# Patient Record
Sex: Female | Born: 1990
Health system: Southern US, Community
[De-identification: ages and names within clinical notes are randomized; demographics above are authoritative.]

## PROBLEM LIST (undated history)

## (undated) DIAGNOSIS — D649 Anemia, unspecified: Secondary | ICD-10-CM

## (undated) DIAGNOSIS — R519 Headache, unspecified: Secondary | ICD-10-CM

## (undated) HISTORY — DX: Headache, unspecified: R51.9

## (undated) HISTORY — DX: Anemia, unspecified: D64.9

---

## 2012-12-23 ENCOUNTER — Other Ambulatory Visit: Payer: Self-pay

## 2012-12-23 MED ORDER — LEVONORGESTREL-ETHINYL ESTRAD 0.1-20 MG-MCG PO TABS: 1.0000 | ORAL_TABLET | Freq: Every day | ORAL | Status: DC

## 2012-12-23 NOTE — Telephone Encounter (Signed)
Last PAP 09/28/11  Last seen 07/05/12   MMM

## 2013-01-20 ENCOUNTER — Other Ambulatory Visit: Payer: Self-pay | Admitting: Nurse Practitioner

## 2013-01-21 ENCOUNTER — Telehealth: Payer: Self-pay | Admitting: Nurse Practitioner

## 2013-01-21 MED ORDER — LEVONORGESTREL-ETHINYL ESTRAD 0.1-20 MG-MCG PO TABS: 1.0000 | ORAL_TABLET | Freq: Every day | ORAL | Status: DC

## 2013-01-21 NOTE — Telephone Encounter (Signed)
Just refilled from rx refills- will redo with refills but needs pap if hasn't had one this year

## 2013-01-21 NOTE — Telephone Encounter (Signed)
Have already been refilled

## 2013-01-21 NOTE — Telephone Encounter (Signed)
MMM TO ADDRESS

## 2013-01-21 NOTE — Telephone Encounter (Signed)
Last seen 07/05/12  MMM Last PAP 5/13

## 2013-01-21 NOTE — Telephone Encounter (Signed)
Patient wants to know why she does not have refills. Her bottle said good thru 2015 but she doesn't have any rfs?

## 2013-01-22 NOTE — Telephone Encounter (Signed)
Pt aware that done with rf's

## 2013-02-21 ENCOUNTER — Other Ambulatory Visit: Payer: Self-pay | Admitting: Nurse Practitioner

## 2013-02-24 NOTE — Telephone Encounter (Signed)
Last seen 07/05/12  MMM  Last PAP 09/27/11

## 2013-03-12 ENCOUNTER — Other Ambulatory Visit: Payer: Self-pay | Admitting: Nurse Practitioner

## 2013-04-16 ENCOUNTER — Other Ambulatory Visit: Payer: Self-pay | Admitting: Nurse Practitioner

## 2013-05-05 ENCOUNTER — Encounter: Payer: Self-pay | Admitting: Nurse Practitioner

## 2013-05-05 ENCOUNTER — Encounter (INDEPENDENT_AMBULATORY_CARE_PROVIDER_SITE_OTHER): Payer: Self-pay

## 2013-05-05 ENCOUNTER — Ambulatory Visit (INDEPENDENT_AMBULATORY_CARE_PROVIDER_SITE_OTHER): Payer: 59 | Admitting: Nurse Practitioner

## 2013-05-05 VITALS — BP 129/75 | HR 81 | Temp 97.9°F | Ht 66.0 in | Wt 165.0 lb

## 2013-05-05 DIAGNOSIS — Z0000 Encounter for general adult medical examination without abnormal findings: Secondary | ICD-10-CM

## 2013-05-05 DIAGNOSIS — Z124 Encounter for screening for malignant neoplasm of cervix: Secondary | ICD-10-CM

## 2013-05-05 DIAGNOSIS — Z01419 Encounter for gynecological examination (general) (routine) without abnormal findings: Secondary | ICD-10-CM

## 2013-05-05 LAB — POCT URINALYSIS DIPSTICK
Bilirubin, UA: NEGATIVE
Glucose, UA: NEGATIVE
Ketones, UA: NEGATIVE
Leukocytes, UA: NEGATIVE
Nitrite, UA: NEGATIVE
pH, UA: 7

## 2013-05-05 MED ORDER — LEVONORGESTREL-ETHINYL ESTRAD 0.1-20 MG-MCG PO TABS: 1.0000 | ORAL_TABLET | Freq: Every day | ORAL | Status: DC

## 2013-05-05 NOTE — Progress Notes (Signed)
   Subjective:    Patient ID: Erika Vasquez, female    DOB: [birth year], 22 y.o.   MRN: [id number]  HPI Patient here today for CPE and PAP- SHe is doing well with no complaints.    Review of Systems  Constitutional: Negative.   HENT: Negative.   Eyes: Negative.   Respiratory: Negative.   Cardiovascular: Negative.   Gastrointestinal: Negative.   Endocrine: Negative.   Genitourinary: Negative.   Musculoskeletal: Negative.   Neurological: Negative.   Psychiatric/Behavioral: Negative.        Objective:   Physical Exam  Constitutional: She is oriented to person, place, and time. She appears well-developed and well-nourished.  HENT:  Head: Normocephalic.  Right Ear: Hearing, tympanic membrane, external ear and ear canal normal.  Left Ear: Hearing, tympanic membrane, external ear and ear canal normal.  Nose: Nose normal.  Mouth/Throat: Uvula is midline and oropharynx is clear and moist.  Eyes: Conjunctivae and EOM are normal. Pupils are equal, round, and reactive to light.  Neck: Normal range of motion and full passive range of motion without pain. Neck supple. No JVD present. Carotid bruit is not present. No mass and no thyromegaly present.  Cardiovascular: Normal rate, normal heart sounds and intact distal pulses.   No murmur heard. Pulmonary/Chest: Effort normal and breath sounds normal. Right breast exhibits no inverted nipple, no mass, no nipple discharge, no skin change and no tenderness. Left breast exhibits no inverted nipple, no mass, no nipple discharge, no skin change and no tenderness.  Abdominal: Soft. Bowel sounds are normal. She exhibits no mass. There is no tenderness.  Genitourinary: Vagina normal and uterus normal. No breast swelling, tenderness, discharge or bleeding.  bimanual exam-No adnexal masses or tenderness. Cervix nonparous and pink   Musculoskeletal: Normal range of motion.  Lymphadenopathy:    She has no cervical adenopathy.  Neurological: She is  alert and oriented to person, place, and time.  Skin: Skin is warm and dry.  Psychiatric: She has a normal mood and affect. Her behavior is normal. Judgment and thought content normal.   BP 129/75  Pulse 81  Temp(Src) 97.9 F (36.6 C) (Oral)  Ht 5\' 6"  (1.676 m)  Wt 165 lb (74.844 kg)  BMI 26.64 kg/m2        Assessment & Plan:   1. Annual physical exam   2. Encounter for routine gynecological examination    Orders Placed This Encounter  Procedures  . POCT urinalysis dipstick   Meds ordered this encounter  Medications  . levonorgestrel-ethinyl estradiol (AVIANE) 0.1-20 MG-MCG tablet    Sig: Take 1 tablet by mouth daily.    Dispense:  28 tablet    Refill:  11    Order Specific Question:  Supervising Provider    Answer:  Ernestina Penna [[id number]]    Continue all meds Labs pending Diet and exercise encouraged Health maintenance reviewed Follow up in 1 year  Mary-Margaret Daphine Deutscher, FNP

## 2013-05-05 NOTE — Addendum Note (Signed)
Addended by: Orma Render F on: 05/05/2013 11:33 AM   Modules accepted: Orders

## 2013-05-05 NOTE — Patient Instructions (Signed)
Health Maintenance, 18- to 24-Year-Old SCHOOL PERFORMANCE After high school completion, the young adult may be attending college, technical or vocational school, or entering the military or the work force. SOCIAL AND EMOTIONAL DEVELOPMENT The young adult establishes adult relationships and explores sexual identity. Young adults may be living at home or in a college dorm or apartment. Increasing independence is important with young adults. Throughout these years, young adults should assume responsibility of their own health care. RECOMMENDED IMMUNIZATIONS  Influenza vaccine.  All adults should be immunized every year.  All adults, including pregnant women and people with hives-only allergy to eggs can receive the inactivated influenza (IIV) vaccine.  Adults aged 18 49 years can receive the recombinant influenza (RIV) vaccine. The RIV vaccine does not contain any egg protein.  Tetanus, diphtheria, and acellular pertussis (Td, Tdap) vaccine.  Pregnant women should receive 1 dose of Tdap vaccine during each pregnancy. The dose should be obtained regardless of the length of time since the last dose. Immunization is preferred during the 27th to 36th week of gestation.  An adult who has not previously received Tdap or who does not know his or her vaccine status should receive 1 dose of Tdap. This initial dose should be followed by tetanus and diphtheria toxoids (Td) booster doses every 10 years.  Adults with an unknown or incomplete history of completing a 3-dose immunization series with Td-containing vaccines should begin or complete a primary immunization series including a Tdap dose.  Adults should receive a Td booster every 10 years.  Varicella vaccine.  An adult without evidence of immunity to varicella should receive 2 doses or a second dose if he or she has previously received 1 dose.  Pregnant females who do not have evidence of immunity should receive the first dose after pregnancy.  This first dose should be obtained before leaving the health care facility. The second dose should be obtained 4 8 weeks after the first dose.  Human papillomavirus (HPV) vaccine.  Females aged 13 26 years who have not received the vaccine previously should obtain the 3-dose series.  The vaccine is not recommended for use in pregnant females. However, pregnancy testing is not needed before receiving a dose. If a female is found to be pregnant after receiving a dose, no treatment is needed. In that case, the remaining doses should be delayed until after the pregnancy.  Males aged 13 21 years who have not received the vaccine previously should receive the 3-dose series. Males aged 22 26 years may be immunized.  Immunization is recommended through the age of 26 years for any female who has sex with males and did not get any or all doses earlier.  Immunization is recommended for any person with an immunocompromised condition through the age of 26 years if he or she did not get any or all doses earlier.  During the 3-dose series, the second dose should be obtained 4 8 weeks after the first dose. The third dose should be obtained 24 weeks after the first dose and 16 weeks after the second dose.  Measles, mumps, and rubella (MMR) vaccine.  Adults born in 1957 or later should have 1 or more doses of MMR vaccine unless there is a contraindication to the vaccine or there is laboratory evidence of immunity to each of the three diseases.  A routine second dose of MMR vaccine should be obtained at least 28 days after the first dose for students attending postsecondary schools, health care workers, or international travelers.    For females of childbearing age, rubella immunity should be determined. If there is no evidence of immunity, females who are not pregnant should be vaccinated. If there is no evidence of immunity, females who are pregnant should delay immunization until after pregnancy.  Pneumococcal  13-valent conjugate (PCV13) vaccine.  When indicated, a person who is uncertain of his or her immunization history and has no record of immunization should receive the PCV13 vaccine.  An adult aged 19 years or older who has certain medical conditions and has not been previously immunized should receive 1 dose of PCV13 vaccine. This PCV13 should be followed with a dose of pneumococcal polysaccharide (PPSV23) vaccine. The PPSV23 vaccine dose should be obtained at least 8 weeks after the dose of PCV13 vaccine.  An adult aged 19 years or older who has certain medical conditions and previously received 1 or more doses of PPSV23 vaccine should receive 1 dose of PCV13. The PCV13 vaccine dose should be obtained 1 or more years after the last PPSV23 vaccine dose.  Pneumococcal polysaccharide (PPSV23) vaccine.  When PCV13 is also indicated, PCV13 should be obtained first.  An adult younger than age 65 years who has certain medical conditions should be immunized.  Any person who resides in a nursing home or long-term care facility should be immunized.  An adult smoker should be immunized.  People with an immunocompromised condition and certain other conditions should receive both PCV13 and PPSV23 vaccines.  People with human immunodeficiency virus (HIV) infection should be immunized as soon as possible after diagnosis.  Immunization during chemotherapy or radiation therapy should be avoided.  Routine use of PPSV23 vaccine is not recommended for American Indians, [place] Natives, or people younger than 65 years unless there are medical conditions that require PPSV23 vaccine.  When indicated, people who have unknown immunization and have no record of immunization should receive PPSV23 vaccine.  One-time revaccination 5 years after the first dose of PPSV23 is recommended for people aged 19 64 years who have chronic kidney failure, nephrotic syndrome, asplenia, or immunocompromised  conditions.  Meningococcal vaccine.  Adults with asplenia or persistent complement component deficiencies should receive 2 doses of quadrivalent meningococcal conjugate (MenACWY-D) vaccine. The doses should be obtained at least 2 months apart.  Microbiologists working with certain meningococcal bacteria, military recruits, people at risk during an outbreak, and people who travel to or live in countries with a high rate of meningitis should be immunized.  A first-year college student up through age 24 years who is living in a residence hall should receive a dose if he or she did not receive a dose on or after his or her 16th birthday.  Adults who have certain high-risk conditions should receive one or more doses of vaccine.  Hepatitis A vaccine.  Adults who wish to be protected from this disease, have certain high-risk conditions, work with hepatitis A-infected animals, work in hepatitis A research labs, or travel to or work in countries with a high rate of hepatitis A should be immunized.  Adults who were previously unvaccinated and who anticipate close contact with an international adoptee during the first 60 days after arrival in the [place] from a country with a high rate of hepatitis A should be immunized.  Hepatitis B vaccine.  Adults who wish to be protected from this disease, have certain high-risk conditions, may be exposed to blood or other infectious body fluids, are household contacts or sex partners of hepatitis B positive people, are clients or workers in   certain care facilities, or travel to or work in countries with a high rate of hepatitis B should be immunized.  Haemophilus influenzae type b (Hib) vaccine.  A previously unvaccinated person with asplenia or sickle cell disease or having a scheduled splenectomy should receive 1 dose of Hib vaccine.  Regardless of previous immunization, a recipient of a hematopoietic stem cell transplant should receive a 3-dose series 6  12 months after his or her successful transplant.  Hib vaccine is not recommended for adults with HIV infection. TESTING Annual screening for vision and hearing problems is recommended. Vision should be screened objectively at least once between 18 24 years of age. The young adult may be screened for anemia or tuberculosis. Young adults should have a blood test to check for high cholesterol during this time period. Young adults should be screened for use of alcohol and drugs. If the young adult is sexually active, screening for sexually transmitted infections, pregnancy, or HIV may be performed.  NUTRITION AND ORAL HEALTH  Adequate calcium intake is important. Consume 3 servings of low-fat milk and dairy products daily. For those who do not drink milk or consume dairy products, calcium enriched foods, such as juice, bread, or cereal, dark, leafy greens, or canned fish are alternate sources of calcium.  Drink plenty of water. Limit fruit juice to 8 12 ounces (240 360 mL) each day. Avoid sugary beverages or sodas.  Discourage skipping meals, especially breakfast. Young adults should eat a good variety of vegetables and fruits, as well as lean meats.  Avoid foods high in fat, salt, or sugar, such as candy, chips, and cookies.  Encourage young adults to participate in meal planning and preparation.  Eat meals together as a family whenever possible. Encourage conversation at mealtime.  Limit fast food choices and eating out at restaurants.  Brush teeth twice a day and floss.  Schedule dental exams twice a year. SLEEP Regular sleep habits are important. PHYSICAL, SOCIAL, AND EMOTIONAL DEVELOPMENT  One hour of regular physical activity daily is recommended. Continue to participate in sports.  Encourage young adults to develop their own interests and consider community service or volunteerism.  Provide guidance to the young adult in making decisions about college and work plans.  Make sure  that young adults know that they should never be in a situation that makes them uncomfortable, and they should tell partners if they do not want to engage in sexual activity.  Talk to the young adult about body image. Eating disorders may be noted at this time. Young adults may also be concerned about being overweight. Monitor the young adult for weight gain or loss.  Mood disturbances, depression, anxiety, alcoholism, or attention problems may be noted in young adults. Talk to the caregiver if there are concerns about mental illness.  Negotiate limit setting and independent decision making.  Encourage the young adult to handle conflict without physical violence.  Avoid loud noises which may impair hearing.  Limit television and computer time to 2 hours each day. Individuals who engage in excessive sedentary activity are more likely to become overweight. RISK BEHAVIORS  Sexually active young adults need to take precautions against pregnancy and sexually transmitted infections. Talk to young adults about contraception.  Provide a tobacco-free and drug-free environment for the young adult. Talk to the young adult about drug, tobacco, and alcohol use among friends or at friend's homes. Make sure the young adult knows that smoking tobacco or marijuana and taking drugs have health consequences and   may impact brain development.  Teach the young adult about appropriate use of over-the-counter or prescription medicines.  Establish guidelines for driving and for riding with friends.  Talk to young adults about the risks of drinking and driving or boating. Encourage the young adult to call you if he or she or friends have been drinking or using drugs.  Remind young adults to wear seat belts at all times in cars and life vests in boats.  Young adults should always wear a properly fitted helmet when they are riding a bicycle.  Use caution with all-terrain vehicles (ATVs) or other motorized  vehicles.  Do not keep handguns in the home. (If you do, the gun and ammunition should be locked separately and out of the young adult's access.)  Equip your home with smoke detectors and change the batteries regularly. Make sure all family members know the fire escape plans for your home.  Teach young adults not to swim alone and not to dive in shallow water.  All individuals should wear sunscreen when out in the sun. This minimizes sunburning. WHAT'S NEXT? Young adults should visit their pediatrician or family physician yearly. By young adulthood, health care should be transitioned to a family physician or internal medicine specialist. Sexually active females may want to begin annual physical exams with a gynecologist. Document Released: 08/10/2006 Document Revised: 09/09/2012 Document Reviewed: 08/30/2006 [employer] Patient Information 2014 [employer].  

## 2013-05-07 LAB — PAP IG, CT-NG, RFX HPV ASCU: Gonococcus by Nucleic Acid Amp: NEGATIVE

## 2013-05-19 ENCOUNTER — Telehealth: Payer: Self-pay | Admitting: Family Medicine

## 2013-05-19 NOTE — Telephone Encounter (Signed)
PT MADE AWARE OF LAB / PAP RESULTS.  RS

## 2013-05-19 NOTE — Telephone Encounter (Signed)
lmtcb

## 2013-05-19 NOTE — Telephone Encounter (Signed)
Message copied by Azalee Course on Mon May 19, 2013 12:48 PM ------      Message from: Bennie Pierini      Created: Thu May 08, 2013  8:08 AM       UA negative      PAP -WNL repeat in 2 years ------

## 2013-05-20 ENCOUNTER — Telehealth: Payer: Self-pay | Admitting: *Deleted

## 2013-05-20 NOTE — Telephone Encounter (Signed)
Message copied by Tamera Punt on Tue May 20, 2013 12:26 PM ------      Message from: Bennie Pierini      Created: Thu May 08, 2013  8:08 AM       UA negative      PAP -WNL repeat in 2 years ------

## 2013-06-03 NOTE — Telephone Encounter (Signed)
Patient aware of results.

## 2013-07-28 ENCOUNTER — Telehealth: Payer: Self-pay | Admitting: Nurse Practitioner

## 2013-07-31 ENCOUNTER — Other Ambulatory Visit: Payer: Self-pay | Admitting: *Deleted

## 2013-07-31 MED ORDER — LEVONORGESTREL-ETHINYL ESTRAD 0.1-20 MG-MCG PO TABS: 1.0000 | ORAL_TABLET | Freq: Every day | ORAL | Status: DC

## 2013-09-17 ENCOUNTER — Telehealth: Payer: Self-pay | Admitting: Nurse Practitioner

## 2013-09-17 NOTE — Telephone Encounter (Signed)
ntbs

## 2013-09-17 NOTE — Telephone Encounter (Signed)
Appointment made

## 2013-09-18 ENCOUNTER — Encounter: Payer: Self-pay | Admitting: Nurse Practitioner

## 2013-09-18 ENCOUNTER — Ambulatory Visit (INDEPENDENT_AMBULATORY_CARE_PROVIDER_SITE_OTHER): Payer: 59 | Admitting: Nurse Practitioner

## 2013-09-18 VITALS — BP 131/75 | HR 78 | Temp 97.9°F | Wt 164.2 lb

## 2013-09-18 DIAGNOSIS — G43509 Persistent migraine aura without cerebral infarction, not intractable, without status migrainosus: Secondary | ICD-10-CM

## 2013-09-18 MED ORDER — SUMATRIPTAN SUCCINATE 50 MG PO TABS: 50.0000 mg | ORAL_TABLET | ORAL | Status: DC | PRN

## 2013-09-18 MED ORDER — ETONOGESTREL-ETHINYL ESTRADIOL 0.12-0.015 MG/24HR VA RING: VAGINAL_RING | VAGINAL | Status: DC

## 2013-09-18 NOTE — Patient Instructions (Signed)
Abdominal Migraine Abdominal migraine is one of several types of migraine. It is one example of "periodic syndrome". The periodic type more commonly occurs in children. Such children usually have a family history of migraine. Children may go on to develop typical migraines later in their lives.  SYMPTOMS  The attacks usually include intermittent periods of abdominal pain. Along with the abdominal pain, other symptoms may occur such as:  Nausea.  Vomiting.  Intense blushing or reddening of the skin (flushing).  Pale appearance to the skin (pallor). DIAGNOSIS  Tests may be done to look for other possible conditions. TREATMENT  Medications that are useful in treating migraine may also work to control these attacks in most children. Document Released: 08/05/2003 Document Revised: 09/09/2012 Document Reviewed: 01/01/2008 [facility] Patient Information 2014 [place], [place].

## 2013-09-18 NOTE — Progress Notes (Signed)
   Subjective:    Patient ID: Erika Vasquez, female    DOB: February 24, 1991, 23 y.o.   MRN: [id number]  HPI  Patient in c/o headaches- Had when she was a little girl and she outgrew them- Started back she thinks about the time she started birth control. SHe can have 2-3 a week and the only thing that really helps is to sleep. No caffeine addiction. Currently has a headache dull 2/10.    Review of Systems  Constitutional: Negative.   Respiratory: Negative.   Cardiovascular: Negative.   Gastrointestinal: Positive for nausea (only with headache).  Genitourinary: Negative.   Neurological: Positive for headaches.  Hematological: Negative.   Psychiatric/Behavioral: Negative.   All other systems reviewed and are negative.      Objective:   Physical Exam  Constitutional: She is oriented to person, place, and time. She appears well-developed and well-nourished.  Cardiovascular: Normal rate, regular rhythm and normal heart sounds.   Pulmonary/Chest: Effort normal and breath sounds normal.  Neurological: She is alert and oriented to person, place, and time. She has normal reflexes.  Skin: Skin is warm.  Psychiatric: She has a normal mood and affect. Her behavior is normal. Judgment and thought content normal.   BP 131/75  Pulse 78  Temp(Src) 97.9 F (36.6 C) (Oral)  Wt 164 lb 3.2 oz (74.481 kg)        Assessment & Plan:   1. Migraine aura, persistent    Meds ordered this encounter  Medications  . etonogestrel-ethinyl estradiol (NUVARING) 0.12-0.015 MG/24HR vaginal ring    Sig: Insert vaginally and leave in place for 3 consecutive weeks, then remove for 1 week.    Dispense:  1 each    Refill:  12    Order Specific Question:  Supervising Provider    Answer:  Ernestina PennaMOORE, DONALD W [[id number]]  . SUMAtriptan (IMITREX) 50 MG tablet    Sig: Take 1 tablet (50 mg total) by mouth every 2 (two) hours as needed for migraine or headache. May repeat in 2 hours if headache persists or recurs.   Dispense:  10 tablet    Refill:  0    Order Specific Question:  Supervising Provider    Answer:  Ernestina PennaMOORE, DONALD W [[id number]]   D/C aviane Keep diary of headaches AVoid caffeine RTO prn  Mary-Margaret Daphine DeutscherMartin, FNP

## 2013-10-01 ENCOUNTER — Telehealth: Payer: Self-pay | Admitting: Nurse Practitioner

## 2013-10-02 MED ORDER — ETONOGESTREL-ETHINYL ESTRADIOL 0.12-0.015 MG/24HR VA RING: VAGINAL_RING | VAGINAL | Status: DC

## 2013-10-02 NOTE — Telephone Encounter (Signed)
rx sent

## 2014-09-27 HISTORY — PX: WISDOM TOOTH EXTRACTION: SHX21

## 2014-09-29 ENCOUNTER — Other Ambulatory Visit: Payer: Self-pay

## 2014-09-29 ENCOUNTER — Telehealth: Payer: Self-pay | Admitting: Nurse Practitioner

## 2014-09-29 MED ORDER — ETONOGESTREL-ETHINYL ESTRADIOL 0.12-0.015 MG/24HR VA RING: VAGINAL_RING | VAGINAL | Status: DC

## 2014-11-24 ENCOUNTER — Encounter: Payer: Self-pay | Admitting: Nurse Practitioner

## 2014-11-24 ENCOUNTER — Ambulatory Visit (INDEPENDENT_AMBULATORY_CARE_PROVIDER_SITE_OTHER): Payer: 59 | Admitting: Nurse Practitioner

## 2014-11-24 VITALS — BP 121/76 | HR 79 | Temp 97.2°F | Wt 169.2 lb

## 2014-11-24 DIAGNOSIS — Z0000 Encounter for general adult medical examination without abnormal findings: Secondary | ICD-10-CM

## 2014-11-24 DIAGNOSIS — Z01419 Encounter for gynecological examination (general) (routine) without abnormal findings: Secondary | ICD-10-CM | POA: Diagnosis not present

## 2014-11-24 LAB — POCT URINALYSIS DIPSTICK
Bilirubin, UA: NEGATIVE
Glucose, UA: NEGATIVE
KETONES UA: NEGATIVE
NITRITE UA: NEGATIVE
PH UA: 6
PROTEIN UA: NEGATIVE
Spec Grav, UA: 1.02
UROBILINOGEN UA: NEGATIVE

## 2014-11-24 LAB — POCT UA - MICROSCOPIC ONLY
Bacteria, U Microscopic: NEGATIVE
CASTS, UR, LPF, POC: NEGATIVE
Crystals, Ur, HPF, POC: NEGATIVE
MUCUS UA: NEGATIVE
YEAST UA: NEGATIVE

## 2014-11-24 MED ORDER — ETONOGESTREL-ETHINYL ESTRADIOL 0.12-0.015 MG/24HR VA RING: VAGINAL_RING | VAGINAL | Status: DC

## 2014-11-24 NOTE — Progress Notes (Signed)
   Subjective:    Patient ID: Erika Vasquez, female    DOB: [birth year], 24 y.o.   MRN: [id number]  HPI Patient in today for CPE and PAP- SHe is doing well without complaints. Her only medical problem is occasional migraines in which she takes imitrex for. Has about 1 x a month.     Review of Systems  Constitutional: Negative.   HENT: Negative.   Respiratory: Negative.   Cardiovascular: Negative.   Gastrointestinal: Negative.   Genitourinary: Negative.   Neurological: Negative.   Psychiatric/Behavioral: Negative.   All other systems reviewed and are negative.      Objective:   Physical Exam  Constitutional: She is oriented to person, place, and time. She appears well-developed and well-nourished.  HENT:  Head: Normocephalic.  Right Ear: Hearing, tympanic membrane, external ear and ear canal normal.  Left Ear: Hearing, tympanic membrane, external ear and ear canal normal.  Nose: Nose normal.  Mouth/Throat: Uvula is midline and oropharynx is clear and moist.  Eyes: Conjunctivae and EOM are normal. Pupils are equal, round, and reactive to light.  Neck: Normal range of motion and full passive range of motion without pain. Neck supple. No JVD present. Carotid bruit is not present. No thyroid mass and no thyromegaly present.  Cardiovascular: Normal rate, normal heart sounds and intact distal pulses.   No murmur heard. Pulmonary/Chest: Effort normal and breath sounds normal. Right breast exhibits no inverted nipple, no mass, no nipple discharge, no skin change and no tenderness. Left breast exhibits no inverted nipple, no mass, no nipple discharge, no skin change and no tenderness.  Abdominal: Soft. Bowel sounds are normal. She exhibits no mass. There is no tenderness.  Genitourinary: Vagina normal and uterus normal. No breast swelling, tenderness, discharge or bleeding.  bimanual exam-No adnexal masses or tenderness. Cervix nonparous and pink- no vaginal discharge     Musculoskeletal: Normal range of motion.  Lymphadenopathy:    She has no cervical adenopathy.  Neurological: She is alert and oriented to person, place, and time.  Skin: Skin is warm and dry.  Psychiatric: She has a normal mood and affect. Her behavior is normal. Judgment and thought content normal.    BP 121/76 mmHg  Pulse 79  Temp(Src) 97.2 F (36.2 C) (Oral)  Wt 169 lb 3.2 oz (76.749 kg)  LMP 11/09/2014       Assessment & Plan:  1. Encounter for routine gynecological examination - POCT UA - Microscopic Only - POCT urinalysis dipstick - Pap IG, CT/NG w/ reflex HPV when ASC-U  2. Annual physical exam - CBC with Differential/Platelet - CMP14+EGFR - Lipid panel - Thyroid Panel With TSH    Labs pending Health maintenance reviewed Diet and exercise encouraged Continue all meds Follow up  In 1 year and prn   Mary-Margaret Hassell Done, FNP

## 2014-11-24 NOTE — Patient Instructions (Signed)
Fat and Cholesterol Control Diet Fat and cholesterol levels in your blood and organs are influenced by your diet. High levels of fat and cholesterol may lead to diseases of the heart, small and large blood vessels, gallbladder, liver, and pancreas. CONTROLLING FAT AND CHOLESTEROL WITH DIET Although exercise and lifestyle factors are important, your diet is key. That is because certain foods are known to raise cholesterol and others to lower it. The goal is to balance foods for their effect on cholesterol and more importantly, to replace saturated and trans fat with other types of fat, such as monounsaturated fat, polyunsaturated fat, and omega-3 fatty acids. On average, a person should consume no more than 15 to 17 g of saturated fat daily. Saturated and trans fats are considered "bad" fats, and they will raise LDL cholesterol. Saturated fats are primarily found in animal products such as meats, butter, and cream. However, that does not mean you need to give up all your favorite foods. Today, there are good tasting, low-fat, low-cholesterol substitutes for most of the things you like to eat. Choose low-fat or nonfat alternatives. Choose round or loin cuts of red meat. These types of cuts are lowest in fat and cholesterol. Chicken (without the skin), fish, veal, and ground turkey breast are great choices. Eliminate fatty meats, such as hot dogs and salami. Even shellfish have little or no saturated fat. Have a 3 oz (85 g) portion when you eat lean meat, poultry, or fish. Trans fats are also called "partially hydrogenated oils." They are oils that have been scientifically manipulated so that they are solid at room temperature resulting in a longer shelf life and improved taste and texture of foods in which they are added. Trans fats are found in stick margarine, some tub margarines, cookies, crackers, and baked goods.  When baking and cooking, oils are a great substitute for butter. The monounsaturated oils are  especially beneficial since it is believed they lower LDL and raise HDL. The oils you should avoid entirely are saturated tropical oils, such as coconut and palm.  Remember to eat a lot from food groups that are naturally free of saturated and trans fat, including fish, fruit, vegetables, beans, grains (barley, rice, couscous, bulgur wheat), and pasta (without cream sauces).  IDENTIFYING FOODS THAT LOWER FAT AND CHOLESTEROL  Soluble fiber may lower your cholesterol. This type of fiber is found in fruits such as apples, vegetables such as broccoli, potatoes, and carrots, legumes such as beans, peas, and lentils, and grains such as barley. Foods fortified with plant sterols (phytosterol) may also lower cholesterol. You should eat at least 2 g per day of these foods for a cholesterol lowering effect.  Read package labels to identify low-saturated fats, trans fat free, and low-fat foods at the supermarket. Select cheeses that have only 2 to 3 g saturated fat per ounce. Use a heart-healthy tub margarine that is free of trans fats or partially hydrogenated oil. When buying baked goods (cookies, crackers), avoid partially hydrogenated oils. Breads and muffins should be made from whole grains (whole-wheat or whole oat flour, instead of "flour" or "enriched flour"). Buy non-creamy canned soups with reduced salt and no added fats.  FOOD PREPARATION TECHNIQUES  Never deep-fry. If you must fry, either stir-fry, which uses very little fat, or use non-stick cooking sprays. When possible, broil, bake, or roast meats, and steam vegetables. Instead of putting butter or margarine on vegetables, use lemon and herbs, applesauce, and cinnamon (for squash and sweet potatoes). Use nonfat   yogurt, salsa, and low-fat dressings for salads.  LOW-SATURATED FAT / LOW-FAT FOOD SUBSTITUTES Meats / Saturated Fat (g)  Avoid: Steak, marbled (3 oz/85 g) / 11 g  Choose: Steak, lean (3 oz/85 g) / 4 g  Avoid: Hamburger (3 oz/85 g) / 7  g  Choose: Hamburger, lean (3 oz/85 g) / 5 g  Avoid: Ham (3 oz/85 g) / 6 g  Choose: Ham, lean cut (3 oz/85 g) / 2.4 g  Avoid: Chicken, with skin, dark meat (3 oz/85 g) / 4 g  Choose: Chicken, skin removed, dark meat (3 oz/85 g) / 2 g  Avoid: Chicken, with skin, light meat (3 oz/85 g) / 2.5 g  Choose: Chicken, skin removed, light meat (3 oz/85 g) / 1 g Dairy / Saturated Fat (g)  Avoid: Whole milk (1 cup) / 5 g  Choose: Low-fat milk, 2% (1 cup) / 3 g  Choose: Low-fat milk, 1% (1 cup) / 1.5 g  Choose: Skim milk (1 cup) / 0.3 g  Avoid: Hard cheese (1 oz/28 g) / 6 g  Choose: Skim milk cheese (1 oz/28 g) / 2 to 3 g  Avoid: Cottage cheese, 4% fat (1 cup) / 6.5 g  Choose: Low-fat cottage cheese, 1% fat (1 cup) / 1.5 g  Avoid: Ice cream (1 cup) / 9 g  Choose: Sherbet (1 cup) / 2.5 g  Choose: Nonfat frozen yogurt (1 cup) / 0.3 g  Choose: Frozen fruit bar / trace  Avoid: Whipped cream (1 tbs) / 3.5 g  Choose: Nondairy whipped topping (1 tbs) / 1 g Condiments / Saturated Fat (g)  Avoid: Mayonnaise (1 tbs) / 2 g  Choose: Low-fat mayonnaise (1 tbs) / 1 g  Avoid: Butter (1 tbs) / 7 g  Choose: Extra light margarine (1 tbs) / 1 g  Avoid: Coconut oil (1 tbs) / 11.8 g  Choose: Olive oil (1 tbs) / 1.8 g  Choose: Corn oil (1 tbs) / 1.7 g  Choose: Safflower oil (1 tbs) / 1.2 g  Choose: Sunflower oil (1 tbs) / 1.4 g  Choose: Soybean oil (1 tbs) / 2.4 g  Choose: Canola oil (1 tbs) / 1 g Document Released: 05/15/2005 Document Revised: 09/09/2012 Document Reviewed: 08/13/2013 Exit[employer] Patient Information 2015 [employer]. This information is not intended to replace advice given to you by your health care provider. Make sure you discuss any questions you have with your health care provider.  

## 2014-11-25 ENCOUNTER — Telehealth: Payer: Self-pay | Admitting: Nurse Practitioner

## 2014-11-25 LAB — CBC WITH DIFFERENTIAL/PLATELET
BASOS: 0 %
Basophils Absolute: 0 10*3/uL (ref 0.0–0.2)
EOS (ABSOLUTE): 0.1 10*3/uL (ref 0.0–0.4)
EOS: 2 %
HEMOGLOBIN: 12.8 g/dL (ref 11.1–15.9)
Hematocrit: 39.1 % (ref 34.0–46.6)
Immature Grans (Abs): 0 10*3/uL (ref 0.0–0.1)
Immature Granulocytes: 0 %
Lymphocytes Absolute: 1.8 10*3/uL (ref 0.7–3.1)
Lymphs: 27 %
MCH: 30 pg (ref 26.6–33.0)
MCHC: 32.7 g/dL (ref 31.5–35.7)
MCV: 92 fL (ref 79–97)
Monocytes Absolute: 0.4 10*3/uL (ref 0.1–0.9)
Monocytes: 6 %
Neutrophils Absolute: 4.3 10*3/uL (ref 1.4–7.0)
Neutrophils: 65 %
Platelets: 246 10*3/uL (ref 150–379)
RBC: 4.26 x10E6/uL (ref 3.77–5.28)
RDW: 12.6 % (ref 12.3–15.4)
WBC: 6.7 10*3/uL (ref 3.4–10.8)

## 2014-11-25 LAB — CMP14+EGFR
ALK PHOS: 66 IU/L (ref 39–117)
ALT: 13 IU/L (ref 0–32)
AST: 20 IU/L (ref 0–40)
Albumin/Globulin Ratio: 1.9 (ref 1.1–2.5)
Albumin: 4.3 g/dL (ref 3.5–5.5)
BUN/Creatinine Ratio: 21 — ABNORMAL HIGH (ref 8–20)
BUN: 16 mg/dL (ref 6–20)
Bilirubin Total: 0.4 mg/dL (ref 0.0–1.2)
CALCIUM: 9.3 mg/dL (ref 8.7–10.2)
CO2: 22 mmol/L (ref 18–29)
Chloride: 103 mmol/L (ref 97–108)
Creatinine, Ser: 0.77 mg/dL (ref 0.57–1.00)
GFR calc Af Amer: 124 mL/min/{1.73_m2} (ref >59)
GFR calc non Af Amer: 108 mL/min/{1.73_m2} (ref >59)
Globulin, Total: 2.3 g/dL (ref 1.5–4.5)
Glucose: 84 mg/dL (ref 65–99)
POTASSIUM: 4 mmol/L (ref 3.5–5.2)
SODIUM: 142 mmol/L (ref 134–144)
Total Protein: 6.6 g/dL (ref 6.0–8.5)

## 2014-11-25 LAB — LIPID PANEL
CHOL/HDL RATIO: 2.9 ratio (ref 0.0–4.4)
CHOLESTEROL TOTAL: 156 mg/dL (ref 100–199)
HDL: 53 mg/dL (ref >39)
LDL Calculated: 77 mg/dL (ref 0–99)
Triglycerides: 130 mg/dL (ref 0–149)
VLDL Cholesterol Cal: 26 mg/dL (ref 5–40)

## 2014-11-25 LAB — THYROID PANEL WITH TSH
Free Thyroxine Index: 2.4 (ref 1.2–4.9)
T3 Uptake Ratio: 21 % — ABNORMAL LOW (ref 24–39)
T4, Total: 11.3 ug/dL (ref 4.5–12.0)
TSH: 1.21 u[IU]/mL (ref 0.450–4.500)

## 2014-11-25 NOTE — Telephone Encounter (Signed)
Pt notified of results Verbalizes understanding 

## 2014-11-26 LAB — PAP IG, CT-NG, RFX HPV ASCU
Chlamydia, Nuc. Acid Amp: NEGATIVE
GONOCOCCUS BY NUCLEIC ACID AMP: NEGATIVE
PAP Smear Comment: 0

## 2015-06-24 DIAGNOSIS — B079 Viral wart, unspecified: Secondary | ICD-10-CM | POA: Diagnosis not present

## 2015-07-22 DIAGNOSIS — B079 Viral wart, unspecified: Secondary | ICD-10-CM | POA: Diagnosis not present

## 2015-08-12 DIAGNOSIS — B079 Viral wart, unspecified: Secondary | ICD-10-CM | POA: Diagnosis not present

## 2015-12-27 ENCOUNTER — Telehealth: Payer: Self-pay | Admitting: Nurse Practitioner

## 2015-12-27 ENCOUNTER — Other Ambulatory Visit: Payer: Self-pay

## 2015-12-27 ENCOUNTER — Other Ambulatory Visit: Payer: Self-pay | Admitting: Nurse Practitioner

## 2015-12-27 MED ORDER — ETONOGESTREL-ETHINYL ESTRADIOL 0.12-0.015 MG/24HR VA RING: VAGINAL_RING | VAGINAL | 11 refills | Status: DC

## 2015-12-27 MED ORDER — ETONOGESTREL-ETHINYL ESTRADIOL 0.12-0.015 MG/24HR VA RING: VAGINAL_RING | VAGINAL | 3 refills | Status: DC

## 2015-12-29 MED FILL — NUVARING VAGINAL RING: 0.12-0.015 | 84 days supply | Qty: 3 | Fill #0

## 2016-03-27 MED FILL — NUVARING VAGINAL RING: 0.12-0.015 | 84 days supply | Qty: 3 | Fill #1

## 2016-04-03 DIAGNOSIS — H5213 Myopia, bilateral: Secondary | ICD-10-CM | POA: Diagnosis not present

## 2016-04-03 DIAGNOSIS — H52223 Regular astigmatism, bilateral: Secondary | ICD-10-CM | POA: Diagnosis not present

## 2016-05-02 ENCOUNTER — Other Ambulatory Visit: Payer: 59 | Admitting: Nurse Practitioner

## 2016-06-19 MED FILL — NUVARING VAGINAL RING: 0.12-0.015 | 84 days supply | Qty: 3 | Fill #2

## 2016-09-04 MED FILL — NUVARING VAGINAL RING: 0.12-0.015 | 84 days supply | Qty: 3 | Fill #3

## 2016-09-20 DIAGNOSIS — B079 Viral wart, unspecified: Secondary | ICD-10-CM | POA: Diagnosis not present

## 2016-09-22 ENCOUNTER — Other Ambulatory Visit: Payer: Self-pay | Admitting: Nurse Practitioner

## 2016-09-22 MED ORDER — SCOPOLAMINE 1 MG/3DAYS TD PT72: 1.0000 | MEDICATED_PATCH | TRANSDERMAL | 0 refills | Status: DC

## 2016-10-02 MED FILL — TRANSDERM-SCOP 1.5 MG/72HR: 1 | 12 days supply | Qty: 4 | Fill #0

## 2016-10-19 DIAGNOSIS — B070 Plantar wart: Secondary | ICD-10-CM | POA: Diagnosis not present

## 2016-11-10 MED FILL — NUVARING VAGINAL RING: 0.12-0.015 | 84 days supply | Qty: 3 | Fill #4

## 2016-11-16 DIAGNOSIS — B079 Viral wart, unspecified: Secondary | ICD-10-CM | POA: Diagnosis not present

## 2017-01-08 DIAGNOSIS — G43909 Migraine, unspecified, not intractable, without status migrainosus: Secondary | ICD-10-CM | POA: Insufficient documentation

## 2017-01-09 DIAGNOSIS — Z130 Encounter for screening for diseases of the blood and blood-forming organs and certain disorders involving the immune mechanism: Secondary | ICD-10-CM | POA: Diagnosis not present

## 2017-01-09 DIAGNOSIS — G43909 Migraine, unspecified, not intractable, without status migrainosus: Secondary | ICD-10-CM | POA: Diagnosis not present

## 2017-01-09 DIAGNOSIS — Z3169 Encounter for other general counseling and advice on procreation: Secondary | ICD-10-CM | POA: Diagnosis not present

## 2017-01-09 DIAGNOSIS — Z01419 Encounter for gynecological examination (general) (routine) without abnormal findings: Secondary | ICD-10-CM | POA: Diagnosis not present

## 2017-01-15 DIAGNOSIS — B070 Plantar wart: Secondary | ICD-10-CM | POA: Diagnosis not present

## 2017-01-22 ENCOUNTER — Encounter: Payer: Self-pay | Admitting: Nurse Practitioner

## 2017-01-22 ENCOUNTER — Ambulatory Visit (INDEPENDENT_AMBULATORY_CARE_PROVIDER_SITE_OTHER): Payer: 59 | Admitting: Nurse Practitioner

## 2017-01-22 VITALS — BP 121/83 | HR 79 | Temp 97.3°F | Ht 66.0 in | Wt 182.0 lb

## 2017-01-22 DIAGNOSIS — G43009 Migraine without aura, not intractable, without status migrainosus: Secondary | ICD-10-CM

## 2017-01-22 NOTE — Progress Notes (Signed)
   Subjective:    Patient ID: Erika Vasquez, female    DOB: April 27, 1991, 26 y.o.   MRN: [id number]  HPI patient comes in today accompanied by her mom to discuss her migranes. She went to see her OB doctor last week to discuss coming off of birth contorl next month to attempt getting pregnant. Her ob suggested she have a follow up about her migraine. SHe had migraines as a teen. They resolved on their own. When she started on birth control they returned ony prior during and after menses - 4 days out of that 76=7 day tome period. We finally changed her  To nuvaring and they improved some but still occur monthly. She uses tylenol which helps. inmitrex only made her sleepy so she stopped using it.     Review of Systems  Constitutional: Negative for activity change and appetite change.  HENT: Negative.   Eyes: Negative for pain.  Respiratory: Negative for shortness of breath.   Cardiovascular: Negative for chest pain, palpitations and leg swelling.  Gastrointestinal: Negative for abdominal pain.  Endocrine: Negative for polydipsia.  Genitourinary: Negative.   Skin: Negative for rash.  Neurological: Negative for dizziness, weakness and headaches.  Hematological: Does not bruise/bleed easily.  Psychiatric/Behavioral: Negative.   All other systems reviewed and are negative.      Objective:   Physical Exam  Constitutional: She is oriented to person, place, and time. She appears well-developed and well-nourished. No distress.  Pulmonary/Chest: Effort normal and breath sounds normal.  Abdominal: Soft. Bowel sounds are normal.  Neurological: She is alert and oriented to person, place, and time. She has normal reflexes. No cranial nerve deficit.  Skin: Skin is warm.  Psychiatric: She has a normal mood and affect. Her behavior is normal. Judgment and thought content normal.   BP 121/83   Pulse 79   Temp (!) 97.3 F (36.3 C) (Oral)   Ht 5\' 6"  (1.676 m)   Wt 182 lb (82.6 kg)   BMI  29.38 kg/m        Assessment & Plan:   1. Migraine without aura and without status migrainosus, not intractable    Discussed cause of migraines could possibly be birth control Suggested wait until stops birth control and see if resolves on its own Keep diary of episodes Avoid caffeien If continue or worsen once stopping birth control then we will do further testing.- patient in agreement  Mary-Margaret Daphine Deutscher, FNP

## 2017-01-22 NOTE — Patient Instructions (Signed)

## 2017-03-05 DIAGNOSIS — B070 Plantar wart: Secondary | ICD-10-CM | POA: Diagnosis not present

## 2017-03-29 DIAGNOSIS — Z3201 Encounter for pregnancy test, result positive: Secondary | ICD-10-CM | POA: Diagnosis not present

## 2017-03-29 DIAGNOSIS — N912 Amenorrhea, unspecified: Secondary | ICD-10-CM | POA: Diagnosis not present

## 2017-04-18 DIAGNOSIS — Z3A08 8 weeks gestation of pregnancy: Secondary | ICD-10-CM | POA: Diagnosis not present

## 2017-04-18 DIAGNOSIS — Z3689 Encounter for other specified antenatal screening: Secondary | ICD-10-CM | POA: Diagnosis not present

## 2017-04-18 DIAGNOSIS — Z113 Encounter for screening for infections with a predominantly sexual mode of transmission: Secondary | ICD-10-CM | POA: Diagnosis not present

## 2017-04-18 DIAGNOSIS — O26891 Other specified pregnancy related conditions, first trimester: Secondary | ICD-10-CM | POA: Diagnosis not present

## 2017-04-18 DIAGNOSIS — Z3401 Encounter for supervision of normal first pregnancy, first trimester: Secondary | ICD-10-CM | POA: Diagnosis not present

## 2017-04-18 LAB — OB RESULTS CONSOLE ABO/RH: RH TYPE: POSITIVE

## 2017-04-18 LAB — OB RESULTS CONSOLE ANTIBODY SCREEN: ANTIBODY SCREEN: NEGATIVE

## 2017-04-18 LAB — OB RESULTS CONSOLE GC/CHLAMYDIA: Gonorrhea: NEGATIVE

## 2017-04-18 LAB — OB RESULTS CONSOLE RUBELLA ANTIBODY, IGM: RUBELLA: IMMUNE

## 2017-04-18 LAB — OB RESULTS CONSOLE HEPATITIS B SURFACE ANTIGEN: HEP B S AG: NEGATIVE

## 2017-04-23 DIAGNOSIS — Z368A Encounter for antenatal screening for other genetic defects: Secondary | ICD-10-CM | POA: Diagnosis not present

## 2017-05-14 DIAGNOSIS — O34211 Maternal care for low transverse scar from previous cesarean delivery: Secondary | ICD-10-CM | POA: Diagnosis not present

## 2017-05-14 DIAGNOSIS — Z3A12 12 weeks gestation of pregnancy: Secondary | ICD-10-CM | POA: Diagnosis not present

## 2017-05-14 DIAGNOSIS — Z3682 Encounter for antenatal screening for nuchal translucency: Secondary | ICD-10-CM | POA: Diagnosis not present

## 2017-05-29 NOTE — L&D Delivery Note (Signed)
Delivery Note P pushed for an hour and at 8:30 PM a viable female was delivered via Vaginal, Spontaneous (Presentation: ;  ROA ; compound right arm).  APGAR: 8, 9; weight  pending.   Placenta status: delivered intact , .  Cord:3vc  with the following complications:none; schultz .  Cord pH: n /a  Anesthesia:  Epidural Episiotomy:  none Lacerations: Periurethral;Sulcus Suture Repair: 2.0 vicryl 4-0 Est. Blood Loss (mL):  750ml Rectal cytotec of 800mcg placed for boggy uterus  Mom to postpartum.  Baby to Couplet care / Skin to Skin.  Cathrine MusterCecilia W Banga 11/22/2017, 9:10 PM

## 2017-06-07 DIAGNOSIS — H52223 Regular astigmatism, bilateral: Secondary | ICD-10-CM | POA: Diagnosis not present

## 2017-06-07 DIAGNOSIS — H5213 Myopia, bilateral: Secondary | ICD-10-CM | POA: Diagnosis not present

## 2017-06-26 DIAGNOSIS — Z363 Encounter for antenatal screening for malformations: Secondary | ICD-10-CM | POA: Diagnosis not present

## 2017-06-26 DIAGNOSIS — Z3A18 18 weeks gestation of pregnancy: Secondary | ICD-10-CM | POA: Diagnosis not present

## 2017-08-15 DIAGNOSIS — Z8669 Personal history of other diseases of the nervous system and sense organs: Secondary | ICD-10-CM | POA: Insufficient documentation

## 2017-08-31 DIAGNOSIS — Z3403 Encounter for supervision of normal first pregnancy, third trimester: Secondary | ICD-10-CM | POA: Diagnosis not present

## 2017-08-31 DIAGNOSIS — Z3689 Encounter for other specified antenatal screening: Secondary | ICD-10-CM | POA: Diagnosis not present

## 2017-08-31 DIAGNOSIS — Z3A28 28 weeks gestation of pregnancy: Secondary | ICD-10-CM | POA: Diagnosis not present

## 2017-08-31 DIAGNOSIS — Z23 Encounter for immunization: Secondary | ICD-10-CM | POA: Diagnosis not present

## 2017-08-31 LAB — OB RESULTS CONSOLE RPR: RPR: NONREACTIVE

## 2017-08-31 LAB — OB RESULTS CONSOLE HIV ANTIBODY (ROUTINE TESTING): HIV: NONREACTIVE

## 2017-09-28 ENCOUNTER — Encounter: Payer: Self-pay | Admitting: *Deleted

## 2017-09-28 ENCOUNTER — Ambulatory Visit (INDEPENDENT_AMBULATORY_CARE_PROVIDER_SITE_OTHER): Payer: 59

## 2017-09-28 VITALS — BP 130/96 | Wt 192.6 lb

## 2017-09-28 DIAGNOSIS — O09293 Supervision of pregnancy with other poor reproductive or obstetric history, third trimester: Secondary | ICD-10-CM

## 2017-09-28 DIAGNOSIS — O3663X0 Maternal care for excessive fetal growth, third trimester, not applicable or unspecified: Secondary | ICD-10-CM | POA: Diagnosis not present

## 2017-09-28 DIAGNOSIS — Z8751 Personal history of pre-term labor: Secondary | ICD-10-CM

## 2017-09-28 DIAGNOSIS — Z3A32 32 weeks gestation of pregnancy: Secondary | ICD-10-CM | POA: Diagnosis not present

## 2017-09-28 MED ORDER — BETAMETHASONE SOD PHOS & ACET 6 (3-3) MG/ML IJ SUSP
12.0000 mg | Freq: Once | INTRAMUSCULAR | Status: AC
  Administered 2017-09-28: 12 mg via INTRAMUSCULAR

## 2017-09-28 NOTE — Progress Notes (Signed)
Pt came in for Betamethasone Injection, next injection in MAU within 24 hrs.Pt verbalized understanding.

## 2017-09-29 ENCOUNTER — Inpatient Hospital Stay (HOSPITAL_COMMUNITY)
Admission: AD | Admit: 2017-09-29 | Discharge: 2017-09-29 | Disposition: A | Discharge status: Home / Self Care | Payer: 59 | Source: Ambulatory Visit | Attending: Obstetrics and Gynecology | Admitting: Obstetrics and Gynecology

## 2017-09-29 DIAGNOSIS — O09293 Supervision of pregnancy with other poor reproductive or obstetric history, third trimester: Secondary | ICD-10-CM | POA: Insufficient documentation

## 2017-09-29 DIAGNOSIS — Z3A32 32 weeks gestation of pregnancy: Secondary | ICD-10-CM | POA: Insufficient documentation

## 2017-09-29 MED ORDER — BETAMETHASONE SOD PHOS & ACET 6 (3-3) MG/ML IJ SUSP
12.0000 mg | Freq: Once | INTRAMUSCULAR | Status: AC
  Administered 2017-09-29: 12 mg via INTRAMUSCULAR
  Filled 2017-09-29: qty 2

## 2017-10-02 DIAGNOSIS — O139 Gestational [pregnancy-induced] hypertension without significant proteinuria, unspecified trimester: Secondary | ICD-10-CM | POA: Diagnosis not present

## 2017-10-02 DIAGNOSIS — R51 Headache: Secondary | ICD-10-CM | POA: Diagnosis not present

## 2017-10-05 DIAGNOSIS — Z3685 Encounter for antenatal screening for Streptococcus B: Secondary | ICD-10-CM | POA: Diagnosis not present

## 2017-11-05 LAB — OB RESULTS CONSOLE GBS: GBS: NEGATIVE

## 2017-11-22 ENCOUNTER — Encounter (HOSPITAL_COMMUNITY): Payer: Self-pay | Admitting: *Deleted

## 2017-11-22 ENCOUNTER — Inpatient Hospital Stay (HOSPITAL_COMMUNITY)
Admission: AD | Admit: 2017-11-22 | Discharge: 2017-11-24 | DRG: 806 | Disposition: A | Discharge status: Home / Self Care | Payer: 59 | Attending: Obstetrics and Gynecology | Admitting: Obstetrics and Gynecology

## 2017-11-22 ENCOUNTER — Inpatient Hospital Stay (HOSPITAL_COMMUNITY): Payer: 59 | Admitting: Anesthesiology

## 2017-11-22 ENCOUNTER — Other Ambulatory Visit: Payer: Self-pay

## 2017-11-22 DIAGNOSIS — Z3490 Encounter for supervision of normal pregnancy, unspecified, unspecified trimester: Secondary | ICD-10-CM

## 2017-11-22 DIAGNOSIS — O26873 Cervical shortening, third trimester: Principal | ICD-10-CM | POA: Diagnosis present

## 2017-11-22 DIAGNOSIS — Z3A40 40 weeks gestation of pregnancy: Secondary | ICD-10-CM | POA: Diagnosis not present

## 2017-11-22 DIAGNOSIS — D649 Anemia, unspecified: Secondary | ICD-10-CM | POA: Diagnosis present

## 2017-11-22 DIAGNOSIS — O9902 Anemia complicating childbirth: Secondary | ICD-10-CM | POA: Diagnosis present

## 2017-11-22 DIAGNOSIS — O26893 Other specified pregnancy related conditions, third trimester: Secondary | ICD-10-CM | POA: Diagnosis present

## 2017-11-22 LAB — CBC
HCT: 34.2 % — ABNORMAL LOW (ref 36.0–46.0)
Hemoglobin: 11.6 g/dL — ABNORMAL LOW (ref 12.0–15.0)
MCH: 32 pg (ref 26.0–34.0)
MCHC: 33.9 g/dL (ref 30.0–36.0)
MCV: 94.2 fL (ref 78.0–100.0)
PLATELETS: 188 10*3/uL (ref 150–400)
RBC: 3.63 MIL/uL — ABNORMAL LOW (ref 3.87–5.11)
RDW: 13.7 % (ref 11.5–15.5)
WBC: 12.1 10*3/uL — ABNORMAL HIGH (ref 4.0–10.5)

## 2017-11-22 LAB — ABO/RH: ABO/RH(D): O POS

## 2017-11-22 LAB — TYPE AND SCREEN
ABO/RH(D): O POS
ANTIBODY SCREEN: NEGATIVE

## 2017-11-22 MED ORDER — ACETAMINOPHEN 325 MG PO TABS: 650.0000 mg | ORAL_TABLET | ORAL | Status: DC | PRN

## 2017-11-22 MED ORDER — IBUPROFEN 600 MG PO TABS
600.0000 mg | ORAL_TABLET | Freq: Four times a day (QID) | ORAL | Status: DC
  Administered 2017-11-22 – 2017-11-24 (×6): 600 mg via ORAL
  Filled 2017-11-22 (×6): qty 1

## 2017-11-22 MED ORDER — PRENATAL MULTIVITAMIN CH
1.0000 | ORAL_TABLET | Freq: Every day | ORAL | Status: DC
  Administered 2017-11-23: 1 via ORAL
  Filled 2017-11-22: qty 1

## 2017-11-22 MED ORDER — DIPHENHYDRAMINE HCL 25 MG PO CAPS: 25.0000 mg | ORAL_CAPSULE | Freq: Four times a day (QID) | ORAL | Status: DC | PRN

## 2017-11-22 MED ORDER — MISOPROSTOL 200 MCG PO TABS
ORAL_TABLET | ORAL | Status: AC
  Filled 2017-11-22: qty 4

## 2017-11-22 MED ORDER — ONDANSETRON HCL 4 MG/2ML IJ SOLN: 4.0000 mg | Freq: Four times a day (QID) | INTRAMUSCULAR | Status: DC | PRN

## 2017-11-22 MED ORDER — LACTATED RINGERS IV SOLN: 500.0000 mL | INTRAVENOUS | Status: DC | PRN

## 2017-11-22 MED ORDER — OXYCODONE HCL 5 MG PO TABS: 10.0000 mg | ORAL_TABLET | ORAL | Status: DC | PRN

## 2017-11-22 MED ORDER — TETANUS-DIPHTH-ACELL PERTUSSIS 5-2.5-18.5 LF-MCG/0.5 IM SUSP: 0.5000 mL | Freq: Once | INTRAMUSCULAR | Status: DC

## 2017-11-22 MED ORDER — OXYCODONE HCL 5 MG PO TABS: 5.0000 mg | ORAL_TABLET | ORAL | Status: DC | PRN

## 2017-11-22 MED ORDER — LIDOCAINE HCL (PF) 1 % IJ SOLN
30.0000 mL | INTRAMUSCULAR | Status: DC | PRN
  Administered 2017-11-22: 30 mL via SUBCUTANEOUS
  Filled 2017-11-22: qty 30

## 2017-11-22 MED ORDER — LACTATED RINGERS IV SOLN: 500.0000 mL | Freq: Once | INTRAVENOUS | Status: DC

## 2017-11-22 MED ORDER — EPHEDRINE 5 MG/ML INJ
10.0000 mg | INTRAVENOUS | Status: DC | PRN
  Filled 2017-11-22: qty 2

## 2017-11-22 MED ORDER — FENTANYL 2.5 MCG/ML BUPIVACAINE 1/10 % EPIDURAL INFUSION (WH - ANES)
14.0000 mL/h | INTRAMUSCULAR | Status: DC | PRN
  Administered 2017-11-22: 14 mL/h via EPIDURAL
  Filled 2017-11-22: qty 100

## 2017-11-22 MED ORDER — LACTATED RINGERS IV SOLN
INTRAVENOUS | Status: DC
  Administered 2017-11-22 (×2): via INTRAVENOUS

## 2017-11-22 MED ORDER — ONDANSETRON HCL 4 MG PO TABS: 4.0000 mg | ORAL_TABLET | ORAL | Status: DC | PRN

## 2017-11-22 MED ORDER — SIMETHICONE 80 MG PO CHEW: 80.0000 mg | CHEWABLE_TABLET | ORAL | Status: DC | PRN

## 2017-11-22 MED ORDER — BENZOCAINE-MENTHOL 20-0.5 % EX AERO: 1.0000 "application " | INHALATION_SPRAY | CUTANEOUS | Status: DC | PRN

## 2017-11-22 MED ORDER — OXYTOCIN 40 UNITS IN LACTATED RINGERS INFUSION - SIMPLE MED
2.5000 [IU]/h | INTRAVENOUS | Status: DC
  Filled 2017-11-22: qty 1000

## 2017-11-22 MED ORDER — COCONUT OIL OIL: 1.0000 "application " | TOPICAL_OIL | Status: DC | PRN

## 2017-11-22 MED ORDER — ONDANSETRON HCL 4 MG/2ML IJ SOLN: 4.0000 mg | INTRAMUSCULAR | Status: DC | PRN

## 2017-11-22 MED ORDER — PHENYLEPHRINE 40 MCG/ML (10ML) SYRINGE FOR IV PUSH (FOR BLOOD PRESSURE SUPPORT)
80.0000 ug | PREFILLED_SYRINGE | INTRAVENOUS | Status: DC | PRN
  Filled 2017-11-22: qty 10
  Filled 2017-11-22: qty 5

## 2017-11-22 MED ORDER — DIPHENHYDRAMINE HCL 50 MG/ML IJ SOLN: 12.5000 mg | INTRAMUSCULAR | Status: DC | PRN

## 2017-11-22 MED ORDER — MISOPROSTOL 200 MCG PO TABS
800.0000 ug | ORAL_TABLET | Freq: Once | ORAL | Status: AC
  Administered 2017-11-22: 800 ug via RECTAL

## 2017-11-22 MED ORDER — WITCH HAZEL-GLYCERIN EX PADS: 1.0000 "application " | MEDICATED_PAD | CUTANEOUS | Status: DC | PRN

## 2017-11-22 MED ORDER — OXYCODONE-ACETAMINOPHEN 5-325 MG PO TABS: 2.0000 | ORAL_TABLET | ORAL | Status: DC | PRN

## 2017-11-22 MED ORDER — BUTORPHANOL TARTRATE 1 MG/ML IJ SOLN
1.0000 mg | INTRAMUSCULAR | Status: DC | PRN
  Administered 2017-11-22: 1 mg via INTRAVENOUS
  Filled 2017-11-22: qty 1

## 2017-11-22 MED ORDER — OXYTOCIN 40 UNITS IN LACTATED RINGERS INFUSION - SIMPLE MED
1.0000 m[IU]/min | INTRAVENOUS | Status: DC
  Administered 2017-11-22: 2 m[IU]/min via INTRAVENOUS

## 2017-11-22 MED ORDER — TERBUTALINE SULFATE 1 MG/ML IJ SOLN
0.2500 mg | Freq: Once | INTRAMUSCULAR | Status: DC | PRN
  Filled 2017-11-22: qty 1

## 2017-11-22 MED ORDER — OXYTOCIN BOLUS FROM INFUSION
500.0000 mL | Freq: Once | INTRAVENOUS | Status: AC
  Administered 2017-11-22: 500 mL via INTRAVENOUS

## 2017-11-22 MED ORDER — SOD CITRATE-CITRIC ACID 500-334 MG/5ML PO SOLN: 30.0000 mL | ORAL | Status: DC | PRN

## 2017-11-22 MED ORDER — DIBUCAINE 1 % RE OINT: 1.0000 "application " | TOPICAL_OINTMENT | RECTAL | Status: DC | PRN

## 2017-11-22 MED ORDER — OXYCODONE-ACETAMINOPHEN 5-325 MG PO TABS: 1.0000 | ORAL_TABLET | ORAL | Status: DC | PRN

## 2017-11-22 MED ORDER — LIDOCAINE HCL (PF) 1 % IJ SOLN
INTRAMUSCULAR | Status: DC | PRN
  Administered 2017-11-22 (×2): 4 mL via EPIDURAL

## 2017-11-22 MED ORDER — SENNOSIDES-DOCUSATE SODIUM 8.6-50 MG PO TABS
2.0000 | ORAL_TABLET | ORAL | Status: DC
  Administered 2017-11-23 (×2): 2 via ORAL
  Filled 2017-11-22 (×2): qty 2

## 2017-11-22 MED ORDER — ZOLPIDEM TARTRATE 5 MG PO TABS: 5.0000 mg | ORAL_TABLET | Freq: Every evening | ORAL | Status: DC | PRN

## 2017-11-22 NOTE — Progress Notes (Signed)
Patient ID: Erika RuddleJennifer Hill Lininger, female   DOB: 1991/02/01, 27 y.o.   MRN: [id number] Pt comfortable with epidural. +Fms VSS CAT 1, 140 Contractions q 1-192mins 9/100/0  A/P: Prime at term progressing well        Recheck in an hour and likely start pushing        Anticipate svd

## 2017-11-22 NOTE — Anesthesia Preprocedure Evaluation (Addendum)
Anesthesia Evaluation  Patient identified by MRN, date of birth, ID band Patient awake    Reviewed: Allergy & Precautions, Patient's Chart, lab work & pertinent test results  Airway Mallampati: II  TM Distance: >3 FB Neck ROM: Full    Dental no notable dental hx. (+) Teeth Intact   Pulmonary neg pulmonary ROS,    Pulmonary exam normal breath sounds clear to auscultation       Cardiovascular negative cardio ROS Normal cardiovascular exam Rhythm:Regular Rate:Normal     Neuro/Psych  Headaches, negative psych ROS   GI/Hepatic negative GI ROS, Neg liver ROS,   Endo/Other  negative endocrine ROS  Renal/GU negative Renal ROS  negative genitourinary   Musculoskeletal negative musculoskeletal ROS (+)   Abdominal   Peds  Hematology  (+) anemia ,   Anesthesia Other Findings   Reproductive/Obstetrics (+) Pregnancy                            Anesthesia Physical Anesthesia Plan  ASA: II  Anesthesia Plan: Epidural   Post-op Pain Management:    Induction:   PONV Risk Score and Plan:   Airway Management Planned: Natural Airway  Additional Equipment:   Intra-op Plan:   Post-operative Plan:   Informed Consent: I have reviewed the patients History and Physical, chart, labs and discussed the procedure including the risks, benefits and alternatives for the proposed anesthesia with the patient or authorized representative who has indicated his/her understanding and acceptance.     Plan Discussed with: Anesthesiologist  Anesthesia Plan Comments:         Anesthesia Quick Evaluation

## 2017-11-22 NOTE — Progress Notes (Signed)
Patient ID: Erika RuddleJennifer Hill Vasquez, female   DOB: 04-05-1991, 27 y.o.   MRN: [id number] Pt doing well - appreciating some contractions but not painful. Continues to leak since AROM at 10am VSS CAT 1 - 150s TOCO - irreg contractions SVE 4-5/95/-2  A/P: Prime at term in latent labor, AROm x 3hours         Will walk for an hour and if contractions do not pick up will start pitocin           Expectant mgmt

## 2017-11-22 NOTE — Anesthesia Procedure Notes (Signed)
Epidural Patient location during procedure: OB Start time: 11/22/2017 3:33 PM  Staffing Anesthesiologist: Mal AmabileFoster, Tiziana Cislo, MD Performed: anesthesiologist   Preanesthetic Checklist Completed: patient identified, site marked, surgical consent, pre-op evaluation, timeout performed, IV checked, risks and benefits discussed and monitors and equipment checked  Epidural Patient position: sitting Prep: site prepped and draped and DuraPrep Patient monitoring: continuous pulse ox and blood pressure Approach: midline Location: L3-L4 Injection technique: LOR air  Needle:  Needle type: Tuohy  Needle gauge: 17 G Needle length: 9 cm and 9 Needle insertion depth: 7 cm Catheter type: closed end flexible Catheter size: 19 Gauge Catheter at skin depth: 12 cm Test dose: negative and Other  Assessment Events: blood not aspirated, injection not painful, no injection resistance, negative IV test and no paresthesia  Additional Notes Patient identified. Risks and benefits discussed including failed block, incomplete  Pain control, post dural puncture headache, nerve damage, paralysis, blood pressure Changes, nausea, vomiting, reactions to medications-both toxic and allergic and post Partum back pain. All questions were answered. Patient expressed understanding and wished to proceed. Sterile technique was used throughout procedure. Epidural site was Dressed with sterile barrier dressing. No paresthesias, signs of intravascular injection Or signs of intrathecal spread were encountered.  Patient was more comfortable after the epidural was dosed. Please see RN's note for documentation of vital signs and FHR which are stable.

## 2017-11-22 NOTE — H&P (Signed)
Erika Vasquez is a 27 y.o. G1P0 female presenting at 5540 0/[redacted]wks gestation for induction of term pregnancy, elective, favorable cervix.Pt is dated per LMP which was confirmed with an 8 week US. At [redacted] weeks gestation she was noted to hve a shortened cervix with preterm cervical dilation; FFN was positive. She received a course of BMZ. Pt progressed to 4cm dilation but stalled. She is GBS negative. Essential panel and first trimester screen negative.  OB History   None    No past medical history on file. Past Surgical History:  Procedure Laterality Date  . WISDOM TOOTH EXTRACTION  09/2014   Family History: family history is not on file. Social History:  reports that she has never smoked. She has never used smokeless tobacco. She reports that she does not drink alcohol or use drugs.     Maternal Diabetes: No Genetic Screening: Normal Maternal Ultrasounds/Referrals: Normal Fetal Ultrasounds or other Referrals:  None Maternal Substance Abuse:  No Significant Maternal Medications:  None Significant Maternal Lab Results:  Lab values include: Group B Strep negative Other Comments:  None  Review of Systems  Constitutional: Negative for chills, fever, malaise/fatigue and weight loss.  Eyes: Negative for blurred vision and double vision.  Respiratory: Negative for shortness of breath.   Cardiovascular: Negative for chest pain.  Gastrointestinal: Negative for abdominal pain, heartburn, nausea and vomiting.  Genitourinary: Negative for dysuria.  Musculoskeletal: Positive for back pain. Negative for myalgias.  Skin: Negative for itching and rash.  Neurological: Negative for dizziness and headaches.  Endo/Heme/Allergies: Negative for environmental allergies. Does not bruise/bleed easily.  Psychiatric/Behavioral: Negative for depression, hallucinations, substance abuse and suicidal ideas. The patient is not nervous/anxious.    Maternal Medical History:  Reason for admission:  Nausea. Term pregnancy, elective, favorable cervix  Contractions: Frequency: rare.   Perceived severity is mild.    Fetal activity: Perceived fetal activity is normal.   Last perceived fetal movement was within the past hour.    Prenatal complications: Preterm labor.   Prenatal Complications - Diabetes: none.      There were no vitals taken for this visit. Maternal Exam:  Uterine Assessment: Contraction strength is mild.  Contraction frequency is rare.   Abdomen: Patient reports generalized tenderness.  Estimated fetal weight is AGA.   Fetal presentation: vertex  Introitus: Normal vulva. Vulva is negative for condylomata and lesion.  Normal vagina.  Vagina is negative for condylomata.  Pelvis: adequate for delivery.   Cervix: Cervix evaluated by digital exam.     Physical Exam  Constitutional: She is oriented to person, place, and time. She appears well-developed and well-nourished.  Neck: Normal range of motion.  Cardiovascular: Normal rate.  Respiratory: Effort normal.  GI: Soft. There is generalized tenderness.  Genitourinary: Vagina normal and uterus normal. Vulva exhibits no lesion.  Musculoskeletal: Normal range of motion. She exhibits no edema.  Neurological: She is alert and oriented to person, place, and time.  Skin: Skin is warm.  Psychiatric: She has a normal mood and affect. Her behavior is normal. Judgment and thought content normal.    Prenatal labs: ABO, Rh:   Antibody:   Rubella:   RPR:    HBsAg:    HIV:    GBS:     Assessment/Plan: G1P0 at 2940 0/[redacted]wks gestation with favorable bishops score for elective iol Pitocin and AROM  Pain control prn GBS neg Anticipate svd    Erika Vasquez 11/22/2017, 9:18 AM

## 2017-11-22 NOTE — Anesthesia Pain Management Evaluation Note (Signed)
  CRNA Pain Management Visit Note  Patient: Erika Vasquez, 27 y.o., female  "Hello I am a [facility] of the anesthesia [facility]. We have an anesthesia team available at all times to provide care throughout the hospital, including epidural management and anesthesia for C-section. I don't know your plan for the delivery whether it a natural birth, water birth, IV sedation, nitrous supplementation, doula or epidural, but we want to meet your pain goals."   1.Was your pain managed to your expectations on prior hospitalizations?   No prior hospitalizations  2.What is your expectation for pain management during this hospitalization?     Epidural  3.How can we help you reach that goal? unsure  Record the patient's initial score and the patient's pain goal.   Pain: 2  Pain Goal: 5 [facility] wants you to be able to say your pain was always managed very well.  Cephus ShellingBURGER,Jaren Vanetten 11/22/2017

## 2017-11-23 LAB — CBC
HCT: 24.9 % — ABNORMAL LOW (ref 36.0–46.0)
Hemoglobin: 8.5 g/dL — ABNORMAL LOW (ref 12.0–15.0)
MCH: 32.3 pg (ref 26.0–34.0)
MCHC: 34.1 g/dL (ref 30.0–36.0)
MCV: 94.7 fL (ref 78.0–100.0)
Platelets: 149 10*3/uL — ABNORMAL LOW (ref 150–400)
RBC: 2.63 MIL/uL — ABNORMAL LOW (ref 3.87–5.11)
RDW: 13.8 % (ref 11.5–15.5)
WBC: 18.7 10*3/uL — ABNORMAL HIGH (ref 4.0–10.5)

## 2017-11-23 LAB — RPR: RPR Ser Ql: NONREACTIVE

## 2017-11-23 NOTE — Lactation Note (Signed)
This note was copied from a baby's chart. Lactation Consultation Note Baby 8 hrs old. Has aggressive latch. Will have wide flange then clamps. Demonstrated upper lip flange and chin tug. When unlatched mom's nipple was pinched. Mom has everted nipples. Easily expressed colostrum. Mom stated she has been expressing colostrum. LC expressed 5 ml. Encouraged mom to spoon feed after feedings.  Suck assessed w/gloved finger. Baby clamps and bites. Tongue thrusting noted at times. W/massage baby started suckling smoothly. Newborn feeding habits, STS, I&O, cluster feeding, positioning and support discussed. Mom encouraged to feed baby 8-12 times/24 hours and with feeding cues.  Encouraged to call for assistance. Stressed importance of good deep latch. WH/LC brochure given w/resources, support groups and LC services.  Patient Name: Girl Esperanza RichtersJennifer Dedman ZOXWR'UToday's Date: 11/23/2017 Reason for consult: Initial assessment   Maternal Data Has patient been taught Hand Expression?: Yes Does the patient have breastfeeding experience prior to this delivery?: No  Feeding Feeding Type: Breast Fed Length of feed: 1 min  LATCH Score Latch: Repeated attempts needed to sustain latch, nipple held in mouth throughout feeding, stimulation needed to elicit sucking reflex.  Audible Swallowing: A few with stimulation  Type of Nipple: Everted at rest and after stimulation  Comfort (Breast/Nipple): Filling, red/small blisters or bruises, mild/mod discomfort  Hold (Positioning): Assistance needed to correctly position infant at breast and maintain latch.  LATCH Score: 6  Interventions Interventions: Breast feeding basics reviewed;Support pillows;Assisted with latch;Position options;Skin to skin;Expressed milk;Breast massage;Hand express;Breast compression;Adjust position  Lactation Tools Discussed/Used WIC Program: No   Consult Status Consult Status: Follow-up Date: 11/23/17 Follow-up type:  In-patient    Izeyah Deike, Diamond NickelLAURA G 11/23/2017, 5:01 AM

## 2017-11-23 NOTE — Progress Notes (Signed)
Post Partum Day 1 Subjective: no complaints, up ad lib and tolerating PO   States no dizziness with ambulation, normal lochia  Objective: Blood pressure 124/80, pulse 82, temperature (!) 97.5 F (36.4 C), temperature source Oral, resp. rate 16, height 5\' 8"  (1.727 m), weight 86.7 kg (191 lb 1.6 oz), SpO2 98 %, unknown if currently breastfeeding.  Physical Exam:  General: alert and cooperative Lochia: appropriate Uterine Fundus: firm   Recent Labs    11/22/17 0916 11/23/17 0630  HGB 11.6* 8.5*  HCT 34.2* 24.9*    Assessment/Plan: Plan for discharge tomorrow  Working on breastfeeding D/w pt relative anemia   LOS: 1 day   Oliver PilaKathy W Jossalin Chervenak 11/23/2017, 9:51 AM

## 2017-11-23 NOTE — Anesthesia Postprocedure Evaluation (Signed)
Anesthesia Post Note  Patient: Erika DitchJennifer Hill Puig  Procedure(s) Performed: AN AD HOC LABOR EPIDURAL     Patient location during evaluation: Mother Baby Anesthesia Type: Epidural Level of consciousness: awake and alert Pain management: pain level controlled Vital Signs Assessment: post-procedure vital signs reviewed and stable Respiratory status: spontaneous breathing, nonlabored ventilation and respiratory function stable Cardiovascular status: stable Postop Assessment: no headache, no backache, epidural receding, able to ambulate, adequate PO intake, no apparent nausea or vomiting and patient able to bend at knees Anesthetic complications: no    Last Vitals:  Vitals:   11/23/17 0328 11/23/17 0759  BP: 132/74 124/80  Pulse: 83 82  Resp: 18 16  Temp: 36.9 C (!) 36.4 C  SpO2: 98%     Last Pain:  Vitals:   11/23/17 0759  TempSrc: Oral  PainSc: 0-No pain   Pain Goal:                 Land O'LakesMalinova,Zaion Hreha Hristova

## 2017-11-24 MED ORDER — ACETAMINOPHEN 325 MG PO TABS: 650.0000 mg | ORAL_TABLET | ORAL | 0 refills | Status: DC | PRN

## 2017-11-24 MED ORDER — IBUPROFEN 600 MG PO TABS: 600.0000 mg | ORAL_TABLET | Freq: Four times a day (QID) | ORAL | 0 refills | Status: DC

## 2017-11-24 NOTE — Lactation Note (Signed)
This note was copied from a baby's chart. Lactation Consultation Note  Patient Name: Erika Esperanza RichtersJennifer Prioleau ZOXWR'UToday's Date: 11/24/2017 Reason for consult: Follow-up assessment Mom reports feedings are going well.  Baby is at a 3% weight loss.  Discussed milk coming to volume.  Employee pump provided for mom.  Lactation outpatient services and support information reviewed and encouraged prn.  Maternal Data    Feeding Feeding Type: Breast Fed  LATCH Score Latch: Grasps breast easily, tongue down, lips flanged, rhythmical sucking.  Audible Swallowing: Spontaneous and intermittent  Type of Nipple: Everted at rest and after stimulation  Comfort (Breast/Nipple): Soft / non-tender  Hold (Positioning): No assistance needed to correctly position infant at breast.  LATCH Score: 10  Interventions    Lactation Tools Discussed/Used     Consult Status Consult Status: Complete Follow-up type: Call as needed    Huston FoleyMOULDEN, Aldean Suddeth S 11/24/2017, 10:13 AM

## 2017-11-24 NOTE — Discharge Summary (Signed)
OB Discharge Summary     Patient Name: Erika Vasquez DOB: 1991-02-03 MRN: [id number]  Date of admission: 11/22/2017 Delivering MD: Pryor OchoaBANGA, CECILIA [facility]   Date of discharge: 11/24/2017  Admitting diagnosis: INDUCTION Intrauterine pregnancy: 6881w0d     Secondary diagnosis:  Active Problems:   Term pregnancy   SVD (spontaneous vaginal delivery)   Postpartum care following vaginal delivery  Additional problems: mild postpartum hemorrhage     Discharge diagnosis: Term Pregnancy Delivered                                                                                                Post partum procedures:none  Augmentation: AROM and Pitocin  Complications: None  Hospital course:  Induction of Labor With Vaginal Delivery   27 y.o. yo G1P1001 at 4881w0d was admitted to the hospital 11/22/2017 for induction of labor.  Indication for induction: Favorable cervix at term.  Patient had an uncomplicated labor course as follows: Membrane Rupture Time/Date: 10:08 AM ,11/22/2017   Intrapartum Procedures: Episiotomy: None [1]                                         Lacerations:  Periurethral [8];Sulcus [9]  Patient had delivery of a Viable infant.  Information for the patient's newborn:  Kriste BasqueKendrick, Girl Azie [[id number]][[id number]]  Delivery Method: Vag-Spont   11/22/2017  Details of delivery can be found in separate delivery note.  Patient had a routine postpartum course. Patient is discharged home 11/24/17.  Physical exam  Vitals:   11/23/17 0328 11/23/17 0759 11/23/17 2301 11/24/17 0538  BP: 132/74 124/80 (!) 110/51 127/82  Pulse: 83 82 69 74  Resp: 18 16  17   Temp: 98.4 F (36.9 C) (!) 97.5 F (36.4 C) 98 F (36.7 C) 97.9 F (36.6 C)  TempSrc: Oral Oral Oral Oral  SpO2: 98%     Weight:      Height:       General: alert and cooperative Lochia: appropriate Uterine Fundus: firm   Labs: Lab Results  Component Value Date   WBC 18.7 (H) 11/23/2017   HGB 8.5 (L) 11/23/2017   HCT 24.9 (L) 11/23/2017   MCV 94.7 11/23/2017   PLT 149 (L) 11/23/2017   CMP Latest Ref Rng & Units 11/24/2014  Glucose 65 - 99 mg/dL 84  BUN 6 - 20 mg/dL 16  Creatinine 7.820.57 - 9.561.00 mg/dL 2.130.77  Sodium 086134 - 578144 mmol/L 142  Potassium 3.5 - 5.2 mmol/L 4.0  Chloride 97 - 108 mmol/L 103  CO2 18 - 29 mmol/L 22  Calcium 8.7 - 10.2 mg/dL 9.3  Total Protein 6.0 - 8.5 g/dL 6.6  Total Bilirubin 0.0 - 1.2 mg/dL 0.4  Alkaline Phos 39 - 117 IU/L 66  AST 0 - 40 IU/L 20  ALT 0 - 32 IU/L 13    Discharge instruction: per After Visit Summary and "Baby and Me Booklet".  After visit meds:  Allergies as of 11/24/2017   No Known Allergies  Medication List    STOP taking these medications   BONJESTA 20-20 MG Tbcr Generic drug:  Doxylamine-Pyridoxine ER   docusate sodium 100 MG capsule Commonly known as:  COLACE   FEROSUL 325 (65 FE) MG tablet Generic drug:  ferrous sulfate     TAKE these medications   acetaminophen 325 MG tablet Commonly known as:  TYLENOL Take 2 tablets (650 mg total) by mouth every 4 (four) hours as needed (for pain scale < 4).   ibuprofen 600 MG tablet Commonly known as:  ADVIL,MOTRIN Take 1 tablet (600 mg total) by mouth every 6 (six) hours.   PRENATAL VITAMINS PO Take by mouth.       Diet: routine diet  Activity: Advance as tolerated. Pelvic rest for 6 weeks.   Outpatient follow up:6 weeks Follow up Appt:No future appointments. Follow up Visit:No follow-ups on file.  Postpartum contraception: Undecided  Newborn Data: Live born female  Birth Weight: 9 lb 4.7 oz (4216 g) APGAR: 8, 9  Newborn Delivery   Birth date/time:  11/22/2017 20:30:00 Delivery type:  Vaginal, Spontaneous     Baby Feeding: Breast Disposition:home with mother   11/24/2017 Oliver Pila, MD

## 2017-11-24 NOTE — Progress Notes (Signed)
Post Partum Day 2 Subjective: no complaints and tolerating PO  Objective: Blood pressure (!) 110/51, pulse 69, temperature 98 F (36.7 C), temperature source Oral, resp. rate 16, height 5\' 8"  (1.727 m), weight 86.7 kg (191 lb 1.6 oz), SpO2 98 %, unknown if currently breastfeeding.  Physical Exam:  General: alert and cooperative Lochia: appropriate Uterine Fundus: firm   Recent Labs    11/22/17 0916 11/23/17 0630  HGB 11.6* 8.5*  HCT 34.2* 24.9*    Assessment/Plan: Discharge home   LOS: 2 days   Erika Vasquez 11/24/2017, 5:05 AM

## 2017-12-27 DIAGNOSIS — Z124 Encounter for screening for malignant neoplasm of cervix: Secondary | ICD-10-CM | POA: Diagnosis not present

## 2017-12-27 DIAGNOSIS — Z1331 Encounter for screening for depression: Secondary | ICD-10-CM | POA: Diagnosis not present

## 2017-12-27 DIAGNOSIS — Z3009 Encounter for other general counseling and advice on contraception: Secondary | ICD-10-CM | POA: Diagnosis not present

## 2017-12-27 MED FILL — NORETHINDRONE 0.35 MG TAB: 0.35 | 84 days supply | Qty: 84 | Fill #0

## 2018-01-01 MED FILL — SHIPPING COST: 1 days supply | Qty: 1 | Fill #0

## 2019-07-21 DIAGNOSIS — B079 Viral wart, unspecified: Secondary | ICD-10-CM | POA: Diagnosis not present

## 2019-08-08 ENCOUNTER — Other Ambulatory Visit: Payer: Self-pay

## 2019-08-08 ENCOUNTER — Ambulatory Visit: Payer: 59 | Admitting: Podiatry

## 2019-08-08 DIAGNOSIS — B070 Plantar wart: Secondary | ICD-10-CM | POA: Diagnosis not present

## 2019-08-08 NOTE — Progress Notes (Signed)
  Subjective:  Patient ID: Erika Vasquez, female    DOB: 1991-01-15,  MRN: [id number]  Chief Complaint  Patient presents with  . Plantar Warts    Pt states right sub 1st plantar wart since 2018 which has been resistant to cantharone and cryotherapy.    29 y.o. female presents with the above complaint. History confirmed with patient. Seeing dermatology but not noticing improvement.  Objective:  Physical Exam: warm, good capillary refill, no trophic changes or ulcerative lesions, normal DP and PT pulses and normal sensory exam. Left Foot: plantar verruca 1st met/2nd met  Right Foot: verruca with blistering right 1st MPJ, petechiae   Assessment:   1. Verruca plantaris    Plan:  Patient was evaluated and treated and all questions answered.  Verruca plantaris -Educated on etiology -Lesion debrided and destroyed.  Procedure: Destruction of Lesion Location: bilateral forefoot Anesthesia: none Instrumentation: 15 blade, YAG laser Technique: Debridement of lesion , followed by several passes of Yag laser to destroy lesion.   Return in about 2 weeks (around 08/22/2019) for Wart laser.

## 2019-08-29 ENCOUNTER — Other Ambulatory Visit: Payer: 59

## 2019-09-12 ENCOUNTER — Other Ambulatory Visit: Payer: 59

## 2019-09-19 ENCOUNTER — Ambulatory Visit (INDEPENDENT_AMBULATORY_CARE_PROVIDER_SITE_OTHER): Payer: 59 | Admitting: *Deleted

## 2019-09-19 ENCOUNTER — Other Ambulatory Visit: Payer: Self-pay

## 2019-09-19 DIAGNOSIS — B070 Plantar wart: Secondary | ICD-10-CM

## 2019-09-19 NOTE — Progress Notes (Signed)
Patient presents today for laser treatment for plantar warts on the right foot. There are multiple lesions.  Dr. Samuella Cota patient.  All other systems are negative.  Lesions were debrided superficially. Laser therapy was administered to the right foot. The patient tolerated the treatment well. All safety precautions were in place.   Follow up in 2 weeks for laser wart treatment.

## 2019-10-01 ENCOUNTER — Encounter: Payer: Self-pay | Admitting: Family Medicine

## 2019-10-01 ENCOUNTER — Telehealth (INDEPENDENT_AMBULATORY_CARE_PROVIDER_SITE_OTHER): Payer: 59 | Admitting: Family Medicine

## 2019-10-01 ENCOUNTER — Telehealth: Payer: Self-pay | Admitting: Nurse Practitioner

## 2019-10-01 ENCOUNTER — Other Ambulatory Visit: Payer: Self-pay

## 2019-10-01 DIAGNOSIS — M62830 Muscle spasm of back: Secondary | ICD-10-CM | POA: Diagnosis not present

## 2019-10-01 MED ORDER — METHOCARBAMOL 500 MG PO TABS: 500.0000 mg | ORAL_TABLET | Freq: Three times a day (TID) | ORAL | 1 refills | Status: DC | PRN

## 2019-10-01 NOTE — Progress Notes (Signed)
Virtual Visit via Video note  I connected with Erika Vasquez on 10/01/19 at 11:40 AM by video and verified that I am speaking with the correct person using two identifiers. [facility] Erika Vasquez is currently located at home and her mother is currently with her during visit. The provider, Loman Brooklyn, FNP is located in their office at time of visit.  I discussed the limitations, risks, security and privacy concerns of performing an evaluation and management service by video and the availability of in person appointments. I also discussed with the patient that there may be a patient responsible charge related to this service. The patient expressed understanding and agreed to proceed.  Subjective: PCP: Erika Pretty, FNP  Chief Complaint  Patient presents with  . Back Pain   Patient reports she had back pain yesterday morning which was relieved with ibuprofen.  Today when she was changing her daughter's diaper she felt like her back "tightened up".  She keeps having muscle spasms in her back.  She has not experienced this before.  She has not done anything new that she can think of.  She did apply a heating pad this morning which was somewhat effective.   ROS: Per HPI  Current Outpatient Medications:  .  acetaminophen (TYLENOL) 325 MG tablet, Take 2 tablets (650 mg total) by mouth every 4 (four) hours as needed (for pain scale < 4)., Disp: 30 tablet, Rfl: 0 .  etonogestrel-ethinyl estradiol (NUVARING) 0.12-0.015 MG/24HR vaginal ring, NuvaRing 0.12 mg-0.015 mg/24 hr vaginal  Insert 1 vaginal ring every month by vaginal route., Disp: , Rfl:  .  ibuprofen (ADVIL,MOTRIN) 600 MG tablet, Take 1 tablet (600 mg total) by mouth every 6 (six) hours., Disp: 30 tablet, Rfl: 0 .  Prenatal Multivit-Min-Fe-FA (PRENATAL VITAMINS PO), Take by mouth., Disp: , Rfl:   No Known Allergies History reviewed. No pertinent past medical history.  Observations/Objective: Physical  Exam Constitutional:      General: She is not in acute distress.    Appearance: Normal appearance. She is not ill-appearing or toxic-appearing.  Eyes:     General: No scleral icterus.       Right eye: No discharge.        Left eye: No discharge.     Conjunctiva/sclera: Conjunctivae normal.  Pulmonary:     Effort: Pulmonary effort is normal. No respiratory distress.  Neurological:     Mental Status: She is alert and oriented to person, place, and time.  Psychiatric:        Mood and Affect: Mood normal.        Behavior: Behavior normal.        Thought Content: Thought content normal.        Judgment: Judgment normal.    Assessment and Plan: 1. Muscle spasm of back - Encouraged muscle relaxer, muscle rubs, and heating pad. - methocarbamol (ROBAXIN) 500 MG tablet; Take 1 tablet (500 mg total) by mouth 3 (three) times daily as needed for muscle spasms.  Dispense: 30 tablet; Refill: 1   Follow Up Instructions:   I discussed the assessment and treatment plan with the patient. The patient was provided an opportunity to ask questions and all were answered. The patient agreed with the plan and demonstrated an understanding of the instructions.   The patient was advised to call back or seek an in-person evaluation if the symptoms worsen or if the condition fails to improve as anticipated.  The above assessment and management plan was discussed with  the patient. The patient verbalized understanding of and has agreed to the management plan. Patient is aware to call the clinic if symptoms persist or worsen. Patient is aware when to return to the clinic for a follow-up visit. Patient educated on when it is appropriate to go to the emergency department.   Time call ended: 11:45 AM  I provided 7 minutes of face-to-face time during this encounter.   Deliah Boston, MSN, APRN, FNP-C [facility] 10/01/19

## 2019-10-02 ENCOUNTER — Other Ambulatory Visit: Payer: Self-pay

## 2019-10-02 ENCOUNTER — Ambulatory Visit (INDEPENDENT_AMBULATORY_CARE_PROVIDER_SITE_OTHER): Payer: 59 | Admitting: *Deleted

## 2019-10-02 DIAGNOSIS — B070 Plantar wart: Secondary | ICD-10-CM | POA: Diagnosis not present

## 2019-10-02 NOTE — Progress Notes (Signed)
Patient presents today for laser treatment for plantar warts on the right foot. There are multiple lesions.  She is pleased they are looking much better.  Dr. Samuella Cota patient.  All other systems are negative.  Lesions were debrided superficially. Laser therapy was administered to the right foot. The patient tolerated the treatment well. All safety precautions were in place.   Follow up in 2 weeks for laser wart treatment.

## 2019-10-16 ENCOUNTER — Ambulatory Visit (INDEPENDENT_AMBULATORY_CARE_PROVIDER_SITE_OTHER): Payer: 59 | Admitting: *Deleted

## 2019-10-16 ENCOUNTER — Other Ambulatory Visit: Payer: Self-pay

## 2019-10-16 DIAGNOSIS — B070 Plantar wart: Secondary | ICD-10-CM | POA: Diagnosis not present

## 2019-10-16 NOTE — Progress Notes (Addendum)
Patient presents today for laser treatment for plantar warts on the right foot. There are multiple lesions.  There is also a new place medial 1st MPJ left too.  She is frustrated that they are not any better yet, however, they do look better.  Dr. Samuella Cota patient.  All other systems are negative.  Lesions were debrided superficially. Laser therapy was administered to the bilateral feet. The patient tolerated the treatment well. All safety precautions were in place.   Follow up in 2 weeks for laser wart treatment.

## 2019-10-30 ENCOUNTER — Ambulatory Visit (INDEPENDENT_AMBULATORY_CARE_PROVIDER_SITE_OTHER): Payer: 59 | Admitting: *Deleted

## 2019-10-30 ENCOUNTER — Other Ambulatory Visit: Payer: Self-pay

## 2019-10-30 DIAGNOSIS — B070 Plantar wart: Secondary | ICD-10-CM | POA: Diagnosis not present

## 2019-10-30 NOTE — Progress Notes (Signed)
Patient presents today for laser treatment for plantar warts on the plantar forefoot right and sub 1st MPJ left.   There are multiple lesions.  The areas are looking not quite as raised as they were.  Dr. Samuella Cota patient.  All other systems are negative.  Lesions were debrided superficially. Laser therapy was administered to the bilateral feet. The patient tolerated the treatment well. All safety precautions were in place.   Follow up in 2 weeks for laser wart treatment.

## 2019-11-14 ENCOUNTER — Other Ambulatory Visit: Payer: Self-pay

## 2019-11-14 ENCOUNTER — Ambulatory Visit (INDEPENDENT_AMBULATORY_CARE_PROVIDER_SITE_OTHER): Payer: 59 | Admitting: *Deleted

## 2019-11-14 DIAGNOSIS — B070 Plantar wart: Secondary | ICD-10-CM | POA: Diagnosis not present

## 2019-11-14 NOTE — Progress Notes (Signed)
Patient presents today for laser treatment for plantar warts on the plantar forefoot right and sub 1st MPJ left.   There are multiple lesions.  The areas are looking much better.  Dr. Samuella Cota patient.  All other systems are negative.  Lesions were debrided superficially. Laser therapy was administered to the bilateral feet. The patient tolerated the treatment well. All safety precautions were in place.   Follow up in 2 weeks for laser wart treatment.  ~Patient states she is now [redacted] weeks pregnant~

## 2019-11-20 DIAGNOSIS — Z3201 Encounter for pregnancy test, result positive: Secondary | ICD-10-CM | POA: Diagnosis not present

## 2019-11-26 ENCOUNTER — Ambulatory Visit: Payer: 59 | Admitting: *Deleted

## 2019-11-26 ENCOUNTER — Other Ambulatory Visit: Payer: Self-pay

## 2019-11-26 DIAGNOSIS — B070 Plantar wart: Secondary | ICD-10-CM | POA: Diagnosis not present

## 2019-11-27 DIAGNOSIS — Z3201 Encounter for pregnancy test, result positive: Secondary | ICD-10-CM | POA: Diagnosis not present

## 2019-11-27 DIAGNOSIS — O219 Vomiting of pregnancy, unspecified: Secondary | ICD-10-CM | POA: Diagnosis not present

## 2019-11-27 DIAGNOSIS — N911 Secondary amenorrhea: Secondary | ICD-10-CM | POA: Diagnosis not present

## 2019-11-27 NOTE — Progress Notes (Signed)
Patient presents today for laser treatment for plantar warts on the plantar forefoot right and sub 1st MPJ left.   There are multiple lesions.  The areas are better, but still present.  Dr. Samuella Cota patient.  All other systems are negative.  Lesions were debrided superficially. Laser therapy was administered to the bilateral feet. The patient tolerated the treatment well. All safety precautions were in place.   Follow up in 2 weeks for laser wart treatment.   ~Patient states she is now [redacted] weeks pregnant~

## 2019-11-28 ENCOUNTER — Other Ambulatory Visit: Payer: 59

## 2019-12-11 ENCOUNTER — Ambulatory Visit (INDEPENDENT_AMBULATORY_CARE_PROVIDER_SITE_OTHER): Payer: 59 | Admitting: *Deleted

## 2019-12-11 ENCOUNTER — Other Ambulatory Visit: Payer: Self-pay

## 2019-12-11 DIAGNOSIS — B070 Plantar wart: Secondary | ICD-10-CM | POA: Diagnosis not present

## 2019-12-11 NOTE — Progress Notes (Signed)
Patient presents today for laser treatment for plantar warts on the plantar forefoot right and sub 1st MPJ left.   There are multiple lesions.  The areas are better, but still present.  Dr. Samuella Cota patient.  All other systems are negative.  Lesions were debrided superficially. Laser therapy was administered to the bilateral feet. The patient tolerated the treatment well. All safety precautions were in place.   Follow up in 2 weeks for Dr. Samuella Cota to re-evaluate.   ~Patient states she is now [redacted] weeks pregnant~

## 2019-12-16 DIAGNOSIS — Z3689 Encounter for other specified antenatal screening: Secondary | ICD-10-CM | POA: Diagnosis not present

## 2019-12-16 DIAGNOSIS — O26891 Other specified pregnancy related conditions, first trimester: Secondary | ICD-10-CM | POA: Diagnosis not present

## 2019-12-16 DIAGNOSIS — Z3A09 9 weeks gestation of pregnancy: Secondary | ICD-10-CM | POA: Diagnosis not present

## 2019-12-16 DIAGNOSIS — Z363 Encounter for antenatal screening for malformations: Secondary | ICD-10-CM | POA: Diagnosis not present

## 2019-12-16 DIAGNOSIS — Z113 Encounter for screening for infections with a predominantly sexual mode of transmission: Secondary | ICD-10-CM | POA: Diagnosis not present

## 2019-12-16 LAB — OB RESULTS CONSOLE HIV ANTIBODY (ROUTINE TESTING): HIV: NONREACTIVE

## 2019-12-16 LAB — OB RESULTS CONSOLE RUBELLA ANTIBODY, IGM: Rubella: IMMUNE

## 2019-12-16 LAB — OB RESULTS CONSOLE HEPATITIS B SURFACE ANTIGEN: Hepatitis B Surface Ag: NEGATIVE

## 2019-12-16 LAB — OB RESULTS CONSOLE ABO/RH: RH Type: POSITIVE

## 2019-12-16 LAB — OB RESULTS CONSOLE GC/CHLAMYDIA: Gonorrhea: NEGATIVE

## 2019-12-16 LAB — OB RESULTS CONSOLE ANTIBODY SCREEN: Antibody Screen: NEGATIVE

## 2019-12-20 ENCOUNTER — Inpatient Hospital Stay (HOSPITAL_COMMUNITY): Payer: 59

## 2019-12-20 ENCOUNTER — Inpatient Hospital Stay (HOSPITAL_COMMUNITY)
Admission: AD | Admit: 2019-12-20 | Discharge: 2019-12-20 | Disposition: A | Discharge status: Home / Self Care | Payer: 59 | Attending: Obstetrics and Gynecology | Admitting: Obstetrics and Gynecology

## 2019-12-20 ENCOUNTER — Encounter (HOSPITAL_COMMUNITY): Payer: Self-pay | Admitting: Obstetrics and Gynecology

## 2019-12-20 ENCOUNTER — Other Ambulatory Visit: Payer: Self-pay

## 2019-12-20 DIAGNOSIS — O418X10 Other specified disorders of amniotic fluid and membranes, first trimester, not applicable or unspecified: Secondary | ICD-10-CM

## 2019-12-20 DIAGNOSIS — O4691 Antepartum hemorrhage, unspecified, first trimester: Secondary | ICD-10-CM | POA: Diagnosis not present

## 2019-12-20 DIAGNOSIS — O468X1 Other antepartum hemorrhage, first trimester: Secondary | ICD-10-CM

## 2019-12-20 DIAGNOSIS — B9689 Other specified bacterial agents as the cause of diseases classified elsewhere: Secondary | ICD-10-CM | POA: Diagnosis not present

## 2019-12-20 DIAGNOSIS — O23591 Infection of other part of genital tract in pregnancy, first trimester: Secondary | ICD-10-CM | POA: Insufficient documentation

## 2019-12-20 DIAGNOSIS — Z79899 Other long term (current) drug therapy: Secondary | ICD-10-CM | POA: Insufficient documentation

## 2019-12-20 DIAGNOSIS — O209 Hemorrhage in early pregnancy, unspecified: Secondary | ICD-10-CM

## 2019-12-20 DIAGNOSIS — Z3A10 10 weeks gestation of pregnancy: Secondary | ICD-10-CM | POA: Diagnosis not present

## 2019-12-20 DIAGNOSIS — O208 Other hemorrhage in early pregnancy: Secondary | ICD-10-CM | POA: Insufficient documentation

## 2019-12-20 DIAGNOSIS — Z791 Long term (current) use of non-steroidal anti-inflammatories (NSAID): Secondary | ICD-10-CM | POA: Diagnosis not present

## 2019-12-20 DIAGNOSIS — Z3A09 9 weeks gestation of pregnancy: Secondary | ICD-10-CM | POA: Diagnosis not present

## 2019-12-20 LAB — URINALYSIS, ROUTINE W REFLEX MICROSCOPIC
Bilirubin Urine: NEGATIVE
Glucose, UA: NEGATIVE mg/dL
Ketones, ur: NEGATIVE mg/dL
Leukocytes,Ua: NEGATIVE
Nitrite: NEGATIVE
Protein, ur: NEGATIVE mg/dL
RBC / HPF: >50 RBC/hpf — ABNORMAL HIGH (ref 0–5)
Specific Gravity, Urine: 1.024 (ref 1.005–1.030)
pH: 5 (ref 5.0–8.0)

## 2019-12-20 LAB — WET PREP, GENITAL
Sperm: NONE SEEN
Trich, Wet Prep: NONE SEEN
Yeast Wet Prep HPF POC: NONE SEEN

## 2019-12-20 MED ORDER — METRONIDAZOLE 500 MG PO TABS: 500.0000 mg | ORAL_TABLET | Freq: Two times a day (BID) | ORAL | 0 refills | Status: DC

## 2019-12-20 NOTE — Discharge Instructions (Signed)

## 2019-12-20 NOTE — MAU Note (Signed)
Pt reports she fell asleep in daughters room and woke up feeling like she needed to pee. Realized her pants were soaked with blood. Went to the bathroom and heard something fall in the toilet and noticed what appeared to be a large sac or blood clot. Minimal cramping. Called MD and was told to come in and be evaluated.

## 2019-12-20 NOTE — MAU Provider Note (Signed)
History     CSN: [id number]  Arrival date and time: 12/20/19 9509   First Provider Initiated Contact with Patient 12/20/19 0117      Chief Complaint  Patient presents with  . Vaginal Bleeding   Erika Vasquez is a 29 y.o. G2P1001 at [redacted]w[redacted]d who receives care at [facility].  She presents today for Vaginal Bleeding.  Patient reports she passed a large clot around 1130pm while laying down.  Patient states she awoke and noticed she was wet and had bleed through her sheets and when she reached down she noticed the large thought.  Patient reports some cramping after the incident that started in her lower abdomen and radiated to her left side.  Patient reports she takes vaginal progesterone, due to bleeding earlier in the pregnancy, and took her dose tonight. Patient denies cramping or pain.  She further denies recent sexual activity.     OB History    Gravida  2   Para  1   Term  1   Preterm      AB      Living  1     SAB      TAB      Ectopic      Multiple  0   Live Births  1           History reviewed. No pertinent past medical history.  Past Surgical History:  Procedure Laterality Date  . WISDOM TOOTH EXTRACTION  09/2014    History reviewed. No pertinent family history.  Social History   Tobacco Use  . Smoking status: Never Smoker  . Smokeless tobacco: Never Used  Vaping Use  . Vaping Use: Never used  Substance Use Topics  . Alcohol use: No  . Drug use: No    Allergies: No Known Allergies  Medications Prior to Admission  Medication Sig Dispense Refill Last Dose  . acetaminophen (TYLENOL) 325 MG tablet Take 2 tablets (650 mg total) by mouth every 4 (four) hours as needed (for pain scale < 4). 30 tablet 0 Past Week at Unknown time  . Prenatal Multivit-Min-Fe-FA (PRENATAL VITAMINS PO) Take by mouth.   12/19/2019 at Unknown time  . progesterone (ENDOMETRIN) 100 MG vaginal insert Place 100 mg vaginally 2 (two) times daily.   12/19/2019 at  Unknown time  . etonogestrel-ethinyl estradiol (NUVARING) 0.12-0.015 MG/24HR vaginal ring NuvaRing 0.12 mg-0.015 mg/24 hr vaginal  Insert 1 vaginal ring every month by vaginal route.     Marland Kitchen ibuprofen (ADVIL,MOTRIN) 600 MG tablet Take 1 tablet (600 mg total) by mouth every 6 (six) hours. 30 tablet 0   . methocarbamol (ROBAXIN) 500 MG tablet Take 1 tablet (500 mg total) by mouth 3 (three) times daily as needed for muscle spasms. 30 tablet 1     Review of Systems  Genitourinary: Positive for vaginal bleeding. Negative for difficulty urinating, dysuria and vaginal discharge.   Physical Exam   Blood pressure 123/75, pulse 94, temperature 97.7 F (36.5 C), temperature source Oral, resp. rate 15, height 5\' 7"  (1.702 m), weight 81.1 kg, SpO2 100 %, unknown if currently breastfeeding.  Physical Exam Vitals reviewed. Exam conducted with a chaperone present.  Constitutional:      Appearance: Normal appearance.  HENT:     Head: Normocephalic and atraumatic.  Eyes:     Conjunctiva/sclera: Conjunctivae normal.  Cardiovascular:     Rate and Rhythm: Normal rate.  Pulmonary:     Effort: Pulmonary effort is normal. No respiratory distress.  Abdominal:     General: There is no distension.  Genitourinary:    Labia:        Right: No tenderness or lesion.        Left: No tenderness or lesion.      Vagina: Bleeding present. No vaginal discharge.     Cervix: No friability or erythema.     Uterus: Enlarged.      Comments: Speculum Exam: -Normal External Genitalia: Non tender, no apparent discharge or blood at introitus.  -Vaginal Vault: Pink mucosa with good rugae. Scant amt blood in vault -wet prep collected -Cervix:Pink, no lesions, cysts, or polyps.  Appears closed. No active bleeding from os-GC/CT collected -Bimanual Exam:  Size c/w 10-[redacted] week GA  Musculoskeletal:        General: Normal range of motion.  Skin:    General: Skin is warm and dry.  Neurological:     Mental Status: She is alert  and oriented to person, place, and time.  Psychiatric:        Mood and Affect: Mood normal.        Thought Content: Thought content normal.     MAU Course  Procedures  Results for orders placed or performed during the hospital encounter of 12/20/19 (from the past 24 hour(s))  Urinalysis, Routine w reflex microscopic     Status: Abnormal   Collection Time: 12/20/19 12:36 AM  Result Value Ref Range   Color, Urine YELLOW YELLOW   APPearance HAZY (A) CLEAR   Specific Gravity, Urine 1.024 1.005 - 1.030   pH 5.0 5.0 - 8.0   Glucose, UA NEGATIVE NEGATIVE mg/dL   Hgb urine dipstick LARGE (A) NEGATIVE   Bilirubin Urine NEGATIVE NEGATIVE   Ketones, ur NEGATIVE NEGATIVE mg/dL   Protein, ur NEGATIVE NEGATIVE mg/dL   Nitrite NEGATIVE NEGATIVE   Leukocytes,Ua NEGATIVE NEGATIVE   RBC / HPF >50 (H) 0 - 5 RBC/hpf   WBC, UA 6-10 0 - 5 WBC/hpf   Bacteria, UA RARE (A) NONE SEEN   Squamous Epithelial / LPF 6-10 0 - 5   Mucus PRESENT    Hyaline Casts, UA PRESENT    Ca Oxalate Crys, UA PRESENT   Wet prep, genital     Status: Abnormal   Collection Time: 12/20/19  1:30 AM   Specimen: PATH Cytology Cervicovaginal Ancillary Only  Result Value Ref Range   Yeast Wet Prep HPF POC NONE SEEN NONE SEEN   Trich, Wet Prep NONE SEEN NONE SEEN   Clue Cells Wet Prep HPF POC PRESENT (A) NONE SEEN   WBC, Wet Prep HPF POC FEW (A) NONE SEEN   Sperm NONE SEEN    US OB Comp Less 14 Wks  Result Date: 12/20/2019 CLINICAL DATA:  Vaginal bleeding EXAM: OBSTETRIC <14 WK ULTRASOUND TECHNIQUE: Transabdominal ultrasound was performed for evaluation of the gestation as well as the maternal uterus and adnexal regions. COMPARISON:  None. FINDINGS: Intrauterine gestational sac: Single Yolk sac:  Visualized. Embryo:  Visualized. Cardiac Activity: Visualized. Heart Rate: 180 bpm CRL: 26.4 mm   9 w 3 d                  [place] EDC: 07/21/2020 Subchorionic hemorrhage: Small subchorionic hemorrhage along the inferior margin of the  gestational sac. Maternal uterus/adnexae: No uterine masses. Left ovary measures 1.7 x 3.6 x 1.3 cm. Right ovary measures 2.7 x 3.6 x 2.1 cm, with a corpus luteum cyst noted. No pelvic free fluid. IMPRESSION: 1. Single live intrauterine  pregnancy as above, estimated age 36 weeks and 3 days. 2. Small subchorionic hemorrhage. Electronically Signed   By: Sharlet Salina M.D.   On: 12/20/2019 02:06    MDM Pelvic Exam; Wet Prep and GC/CT Labs: UA Ultrasound Assessment and Plan  29 year old G2P1001 SIUP at 10 weeks Vaginal Bleeding  -POC Reviewed. -Exam performed and findings discussed. -Cultures collected and pending.  -Reassured that uterine size c/w GA and findings similar to [facility]. -Will send for [place] and await results.   Cherre Robins 12/20/2019, 1:17 AM   Reassessment (2:12 AM) SCH with IUP at 9.3 weeks Bacterial Vaginosis  -[place] and Lab results return as above. -Educated on [facility], what to expect including bleeding, risks for miscarriage, and resolution.  -Patient encouraged to implement pelvic rest for at least 72 hours after resolution of bleeding. -However, patient informed that work restrictions and/or bed rest not necessary at this time. -Instructed to continue vaginal progesterone dosing.  -Reviewed BV diagnosis and treatment. -Patient requests oral medication and script for Metronidazole sent to pharmacy on file.  -Bleeding Precautions given. -Encouraged to call or return to MAU if symptoms worsen or with the onset of new symptoms. -Discharged to home in stable condition.  Cherre Robins MSN, CNM Advanced Practice Provider, [facility] [employer]

## 2019-12-22 LAB — GC/CHLAMYDIA PROBE AMP (~~LOC~~) NOT AT ARMC
Chlamydia: NEGATIVE
Comment: NEGATIVE
Comment: NORMAL
Neisseria Gonorrhea: NEGATIVE

## 2020-01-02 ENCOUNTER — Other Ambulatory Visit: Payer: Self-pay

## 2020-01-02 ENCOUNTER — Ambulatory Visit: Payer: 59 | Admitting: Podiatry

## 2020-01-02 DIAGNOSIS — B070 Plantar wart: Secondary | ICD-10-CM

## 2020-01-02 NOTE — Progress Notes (Unsigned)
Patient presents today for laser treatment for plantar warts on the plantar forefoot right and sub 1st MPJ left.   There are multiple lesions.  The areas are better, but still present. She has an appointment with Dr. Samuella Cota, but wanted to do one more treatment with me since it was scheduled a month out.  Dr. Samuella Cota patient.  All other systems are negative.  Lesions were debrided superficially. Laser therapy was administered to the bilateral feet. The patient tolerated the treatment well. All safety precautions were in place.   Keep follow up appointment with Dr. Samuella Cota to re-evaluate in 1 week.   ~Patient states she is now [redacted] weeks pregnant~

## 2020-01-06 DIAGNOSIS — Z3A12 12 weeks gestation of pregnancy: Secondary | ICD-10-CM | POA: Diagnosis not present

## 2020-01-06 DIAGNOSIS — Z3481 Encounter for supervision of other normal pregnancy, first trimester: Secondary | ICD-10-CM | POA: Diagnosis not present

## 2020-01-06 DIAGNOSIS — O3430 Maternal care for cervical incompetence, unspecified trimester: Secondary | ICD-10-CM | POA: Diagnosis not present

## 2020-01-09 ENCOUNTER — Ambulatory Visit: Payer: 59 | Admitting: Podiatry

## 2020-01-09 ENCOUNTER — Other Ambulatory Visit: Payer: Self-pay

## 2020-01-09 DIAGNOSIS — B070 Plantar wart: Secondary | ICD-10-CM | POA: Diagnosis not present

## 2020-01-12 NOTE — Progress Notes (Signed)
  Subjective:  Patient ID: Erika Vasquez, female    DOB: November 30, 1990,  MRN: [id number]  Chief Complaint  Patient presents with  . Plantar Warts    Bilateral plantar forefoot. No pain. No new complaints.    30 y.o. female presents with the above complaint. History confirmed with patient.  Objective:  Physical Exam: warm, good capillary refill, no trophic changes or ulcerative lesions, normal DP and PT pulses and normal sensory exam. Left Foot: plantar verruca 1st met/2nd met  Right Foot: verruca with blistering right 1st MPJ, petechiae   Assessment:   1. Verruca plantaris    Plan:  Patient was evaluated and treated and all questions answered.  Verruca plantaris -Improving but resistant to treatment. -Lesions debrided and destroyed. Safety precautions in place.  Procedure: Destruction of Lesion Location: bilat feet Anesthesia: none Instrumentation: 15 blade, YAG laser Technique: Debridement of lesion , followed by several passes of Yag laser to destroy lesion.   No follow-ups on file.

## 2020-01-15 DIAGNOSIS — H5213 Myopia, bilateral: Secondary | ICD-10-CM | POA: Diagnosis not present

## 2020-02-10 ENCOUNTER — Ambulatory Visit: Payer: 59 | Admitting: Podiatry

## 2020-02-18 DIAGNOSIS — Z363 Encounter for antenatal screening for malformations: Secondary | ICD-10-CM | POA: Diagnosis not present

## 2020-02-18 DIAGNOSIS — O4402 Placenta previa specified as without hemorrhage, second trimester: Secondary | ICD-10-CM | POA: Diagnosis not present

## 2020-02-18 DIAGNOSIS — Z3A18 18 weeks gestation of pregnancy: Secondary | ICD-10-CM | POA: Diagnosis not present

## 2020-02-18 DIAGNOSIS — O09293 Supervision of pregnancy with other poor reproductive or obstetric history, third trimester: Secondary | ICD-10-CM | POA: Diagnosis not present

## 2020-02-18 DIAGNOSIS — O09292 Supervision of pregnancy with other poor reproductive or obstetric history, second trimester: Secondary | ICD-10-CM | POA: Diagnosis not present

## 2020-02-18 DIAGNOSIS — O09212 Supervision of pregnancy with history of pre-term labor, second trimester: Secondary | ICD-10-CM | POA: Diagnosis not present

## 2020-03-05 ENCOUNTER — Other Ambulatory Visit: Payer: Self-pay

## 2020-03-05 ENCOUNTER — Ambulatory Visit: Payer: 59 | Admitting: Podiatry

## 2020-03-05 DIAGNOSIS — B070 Plantar wart: Secondary | ICD-10-CM

## 2020-03-18 DIAGNOSIS — Z362 Encounter for other antenatal screening follow-up: Secondary | ICD-10-CM | POA: Diagnosis not present

## 2020-03-18 DIAGNOSIS — O4403 Placenta previa specified as without hemorrhage, third trimester: Secondary | ICD-10-CM | POA: Diagnosis not present

## 2020-03-18 DIAGNOSIS — Z3482 Encounter for supervision of other normal pregnancy, second trimester: Secondary | ICD-10-CM | POA: Diagnosis not present

## 2020-03-18 DIAGNOSIS — Z3A22 22 weeks gestation of pregnancy: Secondary | ICD-10-CM | POA: Diagnosis not present

## 2020-03-26 ENCOUNTER — Other Ambulatory Visit: Payer: Self-pay

## 2020-03-26 ENCOUNTER — Ambulatory Visit: Payer: 59 | Admitting: Podiatry

## 2020-03-26 DIAGNOSIS — B070 Plantar wart: Secondary | ICD-10-CM

## 2020-03-28 NOTE — Progress Notes (Signed)
  Subjective:  Patient ID: Erika Vasquez, female    DOB: [birth year],  MRN: [id number]  No chief complaint on file.   29 y.o. female presents with the above complaint. Here for laser therapy for her warts  Objective:  Physical Exam: warm, good capillary refill, no trophic changes or ulcerative lesions, normal DP and PT pulses and normal sensory exam. Left Foot: plantar verruca 1st met/2nd met  Right Foot: verruca with blistering right 1st MPJ, petechiae   Assessment:   1. Verruca plantaris    Plan:  Patient was evaluated and treated and all questions answered.  Verruca plantaris -Improving -Lesions debrided and destroyed. Safety precautions in place.  Procedure: Destruction of Lesion Location: bilat feet Anesthesia: none Instrumentation: 15 blade, YAG laser Technique: Debridement of lesion , followed by several passes of Yag laser to destroy lesion.   No follow-ups on file.

## 2020-03-28 NOTE — Progress Notes (Signed)
  Subjective:  Patient ID: Erika Vasquez, female    DOB: Aug 02, 1990,  MRN: [id number]  No chief complaint on file.   29 y.o. female presents with the above complaint.  Thinks they are doing better.  Debrided herself today because a little bit of bleeding to the right  Objective:  Physical Exam: warm, good capillary refill, no trophic changes or ulcerative lesions, normal DP and PT pulses and normal sensory exam. Left Foot: plantar verruca 1st met/2nd met  Right Foot: verruca with blistering right 1st MPJ, petechiae   Assessment:   1. Verruca plantaris    Plan:  Patient was evaluated and treated and all questions answered.  Verruca plantaris -Continues to improve.  Again debrided and destroyed with laser today  Procedure: Destruction of Lesion Location: bilat feet Anesthesia: none Instrumentation: 15 blade, YAG laser Technique: Debridement of lesion , followed by several passes of Yag laser to destroy lesion.. No follow-ups on file.

## 2020-04-20 DIAGNOSIS — O4442 Low lying placenta NOS or without hemorrhage, second trimester: Secondary | ICD-10-CM | POA: Diagnosis not present

## 2020-04-20 DIAGNOSIS — Z3689 Encounter for other specified antenatal screening: Secondary | ICD-10-CM | POA: Diagnosis not present

## 2020-04-20 DIAGNOSIS — Z23 Encounter for immunization: Secondary | ICD-10-CM | POA: Diagnosis not present

## 2020-04-20 DIAGNOSIS — Z3A27 27 weeks gestation of pregnancy: Secondary | ICD-10-CM | POA: Diagnosis not present

## 2020-04-30 ENCOUNTER — Other Ambulatory Visit: Payer: Self-pay

## 2020-04-30 ENCOUNTER — Ambulatory Visit: Payer: 59 | Admitting: Podiatry

## 2020-04-30 DIAGNOSIS — B070 Plantar wart: Secondary | ICD-10-CM | POA: Diagnosis not present

## 2020-04-30 NOTE — Progress Notes (Signed)
  Subjective:  Patient ID: Erika Vasquez, female    DOB: 1990-10-08,  MRN: [id number]  No chief complaint on file.   29 y.o. female presents with the above complaint.  Thinks they are doing better.   Objective:  Physical Exam: warm, good capillary refill, no trophic changes or ulcerative lesions, normal DP and PT pulses and normal sensory exam. Right Foot: verruca with blistering right 1st MPJ, petechiae   Assessment:   1. Verruca plantaris    Plan:  Patient was evaluated and treated and all questions answered.  Verruca plantaris -Continues to improve.  Destroyed again as below  Procedure: Destruction of Lesion Location: right foot Anesthesia: none Instrumentation: 15 blade, YAG laser Technique: Debridement of lesion , followed by several passes of Yag laser to destroy lesion.   No follow-ups on file.

## 2020-05-18 DIAGNOSIS — O43123 Velamentous insertion of umbilical cord, third trimester: Secondary | ICD-10-CM | POA: Diagnosis not present

## 2020-05-18 DIAGNOSIS — O09213 Supervision of pregnancy with history of pre-term labor, third trimester: Secondary | ICD-10-CM | POA: Diagnosis not present

## 2020-05-18 DIAGNOSIS — Z3A31 31 weeks gestation of pregnancy: Secondary | ICD-10-CM | POA: Diagnosis not present

## 2020-05-18 DIAGNOSIS — O3663X0 Maternal care for excessive fetal growth, third trimester, not applicable or unspecified: Secondary | ICD-10-CM | POA: Diagnosis not present

## 2020-05-19 ENCOUNTER — Other Ambulatory Visit: Payer: Self-pay | Admitting: Obstetrics and Gynecology

## 2020-05-19 DIAGNOSIS — O43123 Velamentous insertion of umbilical cord, third trimester: Secondary | ICD-10-CM

## 2020-05-20 ENCOUNTER — Other Ambulatory Visit: Payer: Self-pay

## 2020-05-20 ENCOUNTER — Encounter: Payer: Self-pay | Admitting: *Deleted

## 2020-05-20 ENCOUNTER — Other Ambulatory Visit: Payer: Self-pay | Admitting: *Deleted

## 2020-05-20 ENCOUNTER — Ambulatory Visit: Payer: 59 | Admitting: *Deleted

## 2020-05-20 ENCOUNTER — Ambulatory Visit: Payer: 59 | Attending: Obstetrics and Gynecology

## 2020-05-20 VITALS — BP 115/69 | HR 90

## 2020-05-20 DIAGNOSIS — Z3A31 31 weeks gestation of pregnancy: Secondary | ICD-10-CM

## 2020-05-20 DIAGNOSIS — O283 Abnormal ultrasonic finding on antenatal screening of mother: Secondary | ICD-10-CM

## 2020-05-20 DIAGNOSIS — O43123 Velamentous insertion of umbilical cord, third trimester: Secondary | ICD-10-CM

## 2020-05-20 DIAGNOSIS — O09293 Supervision of pregnancy with other poor reproductive or obstetric history, third trimester: Secondary | ICD-10-CM | POA: Diagnosis not present

## 2020-05-29 NOTE — L&D Delivery Note (Signed)
Delivery Note Pt labore quickly to complete with uncontrollable urge to push. She pushed for and at 9:55 AM a viable female was delivered via Vaginal, Spontaneous (Presentation: Left Occiput Transverse).  APGAR: , ; weight pending. Loose nuchal x 1 reduced over infants head at perineum. Anterior and posterior shoulders delivered easily next; body followed   Placenta status: Spontaneous, Intact. Schultz  Cord: 3 vessels with the following complications: None.  Cord pH: n/a  Anesthesia: Epidural Episiotomy: None Lacerations: 1st degree;Periurethral Suture Repair: vicryl 4-0 vicry Est. Blood Loss (mL):    Mom to postpartum.  Baby to Couplet care / Skin to Skin  They desire circumcision for baby.  Cathrine Muster 06/26/2020, 10:19 AM

## 2020-06-04 ENCOUNTER — Ambulatory Visit: Payer: 59 | Attending: Obstetrics and Gynecology

## 2020-06-04 ENCOUNTER — Ambulatory Visit: Payer: 59 | Admitting: Podiatry

## 2020-06-04 ENCOUNTER — Ambulatory Visit: Payer: 59 | Admitting: *Deleted

## 2020-06-04 ENCOUNTER — Other Ambulatory Visit: Payer: Self-pay

## 2020-06-04 ENCOUNTER — Encounter: Payer: Self-pay | Admitting: *Deleted

## 2020-06-04 VITALS — BP 111/70 | HR 71

## 2020-06-04 DIAGNOSIS — O43129 Velamentous insertion of umbilical cord, unspecified trimester: Secondary | ICD-10-CM

## 2020-06-04 DIAGNOSIS — Z3A33 33 weeks gestation of pregnancy: Secondary | ICD-10-CM

## 2020-06-04 DIAGNOSIS — O43123 Velamentous insertion of umbilical cord, third trimester: Secondary | ICD-10-CM | POA: Diagnosis not present

## 2020-06-04 DIAGNOSIS — O283 Abnormal ultrasonic finding on antenatal screening of mother: Secondary | ICD-10-CM | POA: Insufficient documentation

## 2020-06-04 DIAGNOSIS — B070 Plantar wart: Secondary | ICD-10-CM

## 2020-06-04 DIAGNOSIS — O09293 Supervision of pregnancy with other poor reproductive or obstetric history, third trimester: Secondary | ICD-10-CM

## 2020-06-04 DIAGNOSIS — Z362 Encounter for other antenatal screening follow-up: Secondary | ICD-10-CM

## 2020-06-14 ENCOUNTER — Ambulatory Visit: Payer: 59

## 2020-06-14 ENCOUNTER — Other Ambulatory Visit: Payer: 59

## 2020-06-21 NOTE — Progress Notes (Signed)
  Subjective:  Patient ID: Erika Vasquez, female    DOB: 1990/08/16,  MRN: [id number]  No chief complaint on file.   30 y.o. female presents with the above complaint. Thinks the lesions are doing better but not resolved.  Objective:  Physical Exam: warm, good capillary refill, no trophic changes or ulcerative lesions, normal DP and PT pulses and normal sensory exam. Right Foot: verruca with blistering right 1st MPJ, petechiae   Assessment:   1. Verruca plantaris    Plan:  Patient was evaluated and treated and all questions answered.  Verruca plantaris -Again debrided and destroyed. Safety precautions in place.  Procedure: Destruction of Lesion Location: right foot. Anesthesia: none Instrumentation: 15 blade, YAG laser Technique: Debridement of lesion , followed by several passes of Yag laser to destroy lesion.   No follow-ups on file.

## 2020-06-22 LAB — OB RESULTS CONSOLE GBS: GBS: NEGATIVE

## 2020-06-23 DIAGNOSIS — Z3685 Encounter for antenatal screening for Streptococcus B: Secondary | ICD-10-CM | POA: Diagnosis not present

## 2020-06-23 DIAGNOSIS — O43123 Velamentous insertion of umbilical cord, third trimester: Secondary | ICD-10-CM | POA: Diagnosis not present

## 2020-06-23 DIAGNOSIS — O09293 Supervision of pregnancy with other poor reproductive or obstetric history, third trimester: Secondary | ICD-10-CM | POA: Diagnosis not present

## 2020-06-23 DIAGNOSIS — Z3A36 36 weeks gestation of pregnancy: Secondary | ICD-10-CM | POA: Diagnosis not present

## 2020-06-24 ENCOUNTER — Other Ambulatory Visit: Payer: Self-pay | Admitting: Obstetrics and Gynecology

## 2020-06-25 ENCOUNTER — Other Ambulatory Visit: Payer: Self-pay

## 2020-06-26 ENCOUNTER — Inpatient Hospital Stay (HOSPITAL_COMMUNITY): Payer: 59 | Admitting: Anesthesiology

## 2020-06-26 ENCOUNTER — Encounter (HOSPITAL_COMMUNITY): Payer: Self-pay | Admitting: Obstetrics and Gynecology

## 2020-06-26 ENCOUNTER — Inpatient Hospital Stay (HOSPITAL_COMMUNITY)
Admission: AD | Admit: 2020-06-26 | Discharge: 2020-06-27 | DRG: 807 | Disposition: A | Discharge status: Home / Self Care | Payer: 59 | Attending: Obstetrics and Gynecology | Admitting: Obstetrics and Gynecology

## 2020-06-26 ENCOUNTER — Inpatient Hospital Stay (HOSPITAL_COMMUNITY): Payer: 59

## 2020-06-26 DIAGNOSIS — O43123 Velamentous insertion of umbilical cord, third trimester: Secondary | ICD-10-CM | POA: Diagnosis not present

## 2020-06-26 DIAGNOSIS — Z3490 Encounter for supervision of normal pregnancy, unspecified, unspecified trimester: Secondary | ICD-10-CM | POA: Diagnosis not present

## 2020-06-26 DIAGNOSIS — D649 Anemia, unspecified: Secondary | ICD-10-CM | POA: Diagnosis not present

## 2020-06-26 DIAGNOSIS — O9902 Anemia complicating childbirth: Secondary | ICD-10-CM | POA: Diagnosis not present

## 2020-06-26 DIAGNOSIS — O4403 Placenta previa specified as without hemorrhage, third trimester: Secondary | ICD-10-CM | POA: Diagnosis not present

## 2020-06-26 DIAGNOSIS — Z3A37 37 weeks gestation of pregnancy: Secondary | ICD-10-CM

## 2020-06-26 DIAGNOSIS — Z20822 Contact with and (suspected) exposure to covid-19: Secondary | ICD-10-CM | POA: Diagnosis present

## 2020-06-26 DIAGNOSIS — O43893 Other placental disorders, third trimester: Secondary | ICD-10-CM | POA: Diagnosis not present

## 2020-06-26 LAB — CBC
HCT: 28.5 % — ABNORMAL LOW (ref 36.0–46.0)
Hemoglobin: 9 g/dL — ABNORMAL LOW (ref 12.0–15.0)
MCH: 27.7 pg (ref 26.0–34.0)
MCHC: 31.6 g/dL (ref 30.0–36.0)
MCV: 87.7 fL (ref 80.0–100.0)
Platelets: 211 10*3/uL (ref 150–400)
RBC: 3.25 MIL/uL — ABNORMAL LOW (ref 3.87–5.11)
RDW: 13.3 % (ref 11.5–15.5)
WBC: 14.2 10*3/uL — ABNORMAL HIGH (ref 4.0–10.5)
nRBC: 0 % (ref 0.0–0.2)

## 2020-06-26 LAB — TYPE AND SCREEN
ABO/RH(D): O POS
Antibody Screen: NEGATIVE

## 2020-06-26 LAB — SARS CORONAVIRUS 2 BY RT PCR (HOSPITAL ORDER, PERFORMED IN ~~LOC~~ HOSPITAL LAB): SARS Coronavirus 2: NEGATIVE

## 2020-06-26 LAB — RPR: RPR Ser Ql: NONREACTIVE

## 2020-06-26 MED ORDER — OXYCODONE HCL 5 MG PO TABS: 10.0000 mg | ORAL_TABLET | ORAL | Status: DC | PRN

## 2020-06-26 MED ORDER — PHENYLEPHRINE 40 MCG/ML (10ML) SYRINGE FOR IV PUSH (FOR BLOOD PRESSURE SUPPORT)
80.0000 ug | PREFILLED_SYRINGE | INTRAVENOUS | Status: DC | PRN
  Filled 2020-06-26: qty 10

## 2020-06-26 MED ORDER — SOD CITRATE-CITRIC ACID 500-334 MG/5ML PO SOLN: 30.0000 mL | ORAL | Status: DC | PRN

## 2020-06-26 MED ORDER — WITCH HAZEL-GLYCERIN EX PADS
1.0000 "application " | MEDICATED_PAD | CUTANEOUS | Status: DC | PRN
  Administered 2020-06-26: 1 via TOPICAL

## 2020-06-26 MED ORDER — LACTATED RINGERS IV SOLN: 500.0000 mL | INTRAVENOUS | Status: DC | PRN

## 2020-06-26 MED ORDER — ZOLPIDEM TARTRATE 5 MG PO TABS: 5.0000 mg | ORAL_TABLET | Freq: Every evening | ORAL | Status: DC | PRN

## 2020-06-26 MED ORDER — OXYTOCIN-SODIUM CHLORIDE 30-0.9 UT/500ML-% IV SOLN
1.0000 m[IU]/min | INTRAVENOUS | Status: DC
  Administered 2020-06-26: 2 m[IU]/min via INTRAVENOUS
  Filled 2020-06-26: qty 500

## 2020-06-26 MED ORDER — ONDANSETRON HCL 4 MG/2ML IJ SOLN: 4.0000 mg | INTRAMUSCULAR | Status: DC | PRN

## 2020-06-26 MED ORDER — FLEET ENEMA 7-19 GM/118ML RE ENEM: 1.0000 | ENEMA | RECTAL | Status: DC | PRN

## 2020-06-26 MED ORDER — FENTANYL-BUPIVACAINE-NACL 0.5-0.125-0.9 MG/250ML-% EP SOLN
EPIDURAL | Status: AC
  Filled 2020-06-26: qty 250

## 2020-06-26 MED ORDER — ONDANSETRON HCL 4 MG/2ML IJ SOLN: 4.0000 mg | Freq: Four times a day (QID) | INTRAMUSCULAR | Status: DC | PRN

## 2020-06-26 MED ORDER — TERBUTALINE SULFATE 1 MG/ML IJ SOLN: 0.2500 mg | Freq: Once | INTRAMUSCULAR | Status: DC | PRN

## 2020-06-26 MED ORDER — LIDOCAINE HCL (PF) 1 % IJ SOLN: 30.0000 mL | INTRAMUSCULAR | Status: DC | PRN

## 2020-06-26 MED ORDER — OXYTOCIN-SODIUM CHLORIDE 30-0.9 UT/500ML-% IV SOLN
2.5000 [IU]/h | INTRAVENOUS | Status: DC
  Administered 2020-06-26: 2.5 [IU]/h via INTRAVENOUS

## 2020-06-26 MED ORDER — TETANUS-DIPHTH-ACELL PERTUSSIS 5-2.5-18.5 LF-MCG/0.5 IM SUSY: 0.5000 mL | PREFILLED_SYRINGE | Freq: Once | INTRAMUSCULAR | Status: DC

## 2020-06-26 MED ORDER — LACTATED RINGERS IV SOLN: INTRAVENOUS | Status: DC

## 2020-06-26 MED ORDER — COCONUT OIL OIL: 1.0000 "application " | TOPICAL_OIL | Status: DC | PRN

## 2020-06-26 MED ORDER — DIPHENHYDRAMINE HCL 50 MG/ML IJ SOLN: 12.5000 mg | INTRAMUSCULAR | Status: DC | PRN

## 2020-06-26 MED ORDER — SENNOSIDES-DOCUSATE SODIUM 8.6-50 MG PO TABS
2.0000 | ORAL_TABLET | Freq: Every day | ORAL | Status: DC
  Administered 2020-06-27: 2 via ORAL
  Filled 2020-06-26: qty 2

## 2020-06-26 MED ORDER — LIDOCAINE HCL (PF) 1 % IJ SOLN
INTRAMUSCULAR | Status: DC | PRN
  Administered 2020-06-26: 2 mL via EPIDURAL
  Administered 2020-06-26: 10 mL via EPIDURAL

## 2020-06-26 MED ORDER — OXYCODONE-ACETAMINOPHEN 5-325 MG PO TABS: 1.0000 | ORAL_TABLET | ORAL | Status: DC | PRN

## 2020-06-26 MED ORDER — ACETAMINOPHEN 325 MG PO TABS
650.0000 mg | ORAL_TABLET | ORAL | Status: DC | PRN
  Administered 2020-06-26 – 2020-06-27 (×2): 650 mg via ORAL
  Filled 2020-06-26 (×2): qty 2

## 2020-06-26 MED ORDER — PHENYLEPHRINE 40 MCG/ML (10ML) SYRINGE FOR IV PUSH (FOR BLOOD PRESSURE SUPPORT)
80.0000 ug | PREFILLED_SYRINGE | INTRAVENOUS | Status: DC | PRN
  Administered 2020-06-26: 80 ug via INTRAVENOUS

## 2020-06-26 MED ORDER — ACETAMINOPHEN 325 MG PO TABS: 650.0000 mg | ORAL_TABLET | ORAL | Status: DC | PRN

## 2020-06-26 MED ORDER — OXYTOCIN BOLUS FROM INFUSION
333.0000 mL | Freq: Once | INTRAVENOUS | Status: AC
  Administered 2020-06-26: 333 mL via INTRAVENOUS

## 2020-06-26 MED ORDER — PRENATAL MULTIVITAMIN CH
1.0000 | ORAL_TABLET | Freq: Every day | ORAL | Status: DC
  Administered 2020-06-26 – 2020-06-27 (×2): 1 via ORAL
  Filled 2020-06-26 (×2): qty 1

## 2020-06-26 MED ORDER — MISOPROSTOL 25 MCG QUARTER TABLET
25.0000 ug | ORAL_TABLET | ORAL | Status: DC | PRN
  Administered 2020-06-26: 25 ug via VAGINAL
  Filled 2020-06-26: qty 1

## 2020-06-26 MED ORDER — LACTATED RINGERS IV SOLN
500.0000 mL | Freq: Once | INTRAVENOUS | Status: AC
  Administered 2020-06-26: 500 mL via INTRAVENOUS

## 2020-06-26 MED ORDER — IBUPROFEN 600 MG PO TABS
600.0000 mg | ORAL_TABLET | Freq: Four times a day (QID) | ORAL | Status: DC
  Administered 2020-06-26 – 2020-06-27 (×4): 600 mg via ORAL
  Filled 2020-06-26 (×4): qty 1

## 2020-06-26 MED ORDER — EPHEDRINE 5 MG/ML INJ: 10.0000 mg | INTRAVENOUS | Status: DC | PRN

## 2020-06-26 MED ORDER — FENTANYL-BUPIVACAINE-NACL 0.5-0.125-0.9 MG/250ML-% EP SOLN
EPIDURAL | Status: DC | PRN
  Administered 2020-06-26: 12 mL/h via EPIDURAL

## 2020-06-26 MED ORDER — SIMETHICONE 80 MG PO CHEW: 80.0000 mg | CHEWABLE_TABLET | ORAL | Status: DC | PRN

## 2020-06-26 MED ORDER — OXYCODONE HCL 5 MG PO TABS: 5.0000 mg | ORAL_TABLET | ORAL | Status: DC | PRN

## 2020-06-26 MED ORDER — FENTANYL-BUPIVACAINE-NACL 0.5-0.125-0.9 MG/250ML-% EP SOLN: 12.0000 mL/h | EPIDURAL | Status: DC | PRN

## 2020-06-26 MED ORDER — BUTORPHANOL TARTRATE 1 MG/ML IJ SOLN
1.0000 mg | INTRAMUSCULAR | Status: DC | PRN
  Administered 2020-06-26: 1 mg via INTRAVENOUS
  Filled 2020-06-26: qty 1

## 2020-06-26 MED ORDER — DIBUCAINE (PERIANAL) 1 % EX OINT
1.0000 "application " | TOPICAL_OINTMENT | CUTANEOUS | Status: DC | PRN
  Administered 2020-06-26: 1 via RECTAL
  Filled 2020-06-26: qty 28

## 2020-06-26 MED ORDER — OXYCODONE-ACETAMINOPHEN 5-325 MG PO TABS
2.0000 | ORAL_TABLET | ORAL | Status: DC | PRN
Start: 2020-06-26 — End: 2020-06-26

## 2020-06-26 MED ORDER — BENZOCAINE-MENTHOL 20-0.5 % EX AERO
1.0000 "application " | INHALATION_SPRAY | CUTANEOUS | Status: DC | PRN
  Administered 2020-06-26: 1 via TOPICAL
  Filled 2020-06-26: qty 56

## 2020-06-26 MED ORDER — ONDANSETRON HCL 4 MG PO TABS: 4.0000 mg | ORAL_TABLET | ORAL | Status: DC | PRN

## 2020-06-26 MED ORDER — DIPHENHYDRAMINE HCL 25 MG PO CAPS: 25.0000 mg | ORAL_CAPSULE | Freq: Four times a day (QID) | ORAL | Status: DC | PRN

## 2020-06-26 NOTE — Progress Notes (Signed)
Slade Pierpoint,RNC Wasted 189.9 mls of Fentanyl (from epidural bag) in container.   Witnessed by Hazel Sams, RNC

## 2020-06-26 NOTE — Anesthesia Preprocedure Evaluation (Addendum)
Anesthesia Evaluation  Patient identified by MRN, date of birth, ID band Patient awake    Reviewed: Allergy & Precautions, Patient's Chart, lab work & pertinent test results  Airway Mallampati: II  TM Distance: >3 FB Neck ROM: Full    Dental no notable dental hx.    Pulmonary neg pulmonary ROS,    Pulmonary exam normal breath sounds clear to auscultation       Cardiovascular negative cardio ROS Normal cardiovascular exam Rhythm:Regular Rate:Normal     Neuro/Psych  Headaches, negative psych ROS   GI/Hepatic negative GI ROS, Neg liver ROS,   Endo/Other  negative endocrine ROS  Renal/GU negative Renal ROS  negative genitourinary   Musculoskeletal negative musculoskeletal ROS (+)   Abdominal   Peds negative pediatric ROS (+)  Hematology  (+) Blood dyscrasia, anemia , hct 28.5, plt 211   Anesthesia Other Findings   Reproductive/Obstetrics (+) Pregnancy IOL for chronic abruption, 37 weeks                            Anesthesia Physical Anesthesia Plan  ASA: II and emergent  Anesthesia Plan: Epidural   Post-op Pain Management:    Induction:   PONV Risk Score and Plan: 2  Airway Management Planned: Natural Airway  Additional Equipment: None  Intra-op Plan:   Post-operative Plan:   Informed Consent: I have reviewed the patients History and Physical, chart, labs and discussed the procedure including the risks, benefits and alternatives for the proposed anesthesia with the patient or authorized representative who has indicated his/her understanding and acceptance.       Plan Discussed with:   Anesthesia Plan Comments:         Anesthesia Quick Evaluation

## 2020-06-26 NOTE — Anesthesia Postprocedure Evaluation (Signed)
Anesthesia Post Note  Patient: Erika Vasquez  Procedure(s) Performed: AN AD HOC LABOR EPIDURAL     Patient location during evaluation: Mother Baby Anesthesia Type: Epidural Level of consciousness: awake and alert Pain management: pain level controlled Vital Signs Assessment: post-procedure vital signs reviewed and stable Respiratory status: spontaneous breathing, nonlabored ventilation and respiratory function stable Cardiovascular status: stable Postop Assessment: no headache, no backache and epidural receding Anesthetic complications: no   No complications documented.  Last Vitals:  Vitals:   06/26/20 1215 06/26/20 1348  BP: 121/77 125/74  Pulse: 74 70  Resp: 20 18  Temp: 36.5 C 36.7 C  SpO2:      Last Pain:  Vitals:   06/26/20 1348  TempSrc:   PainSc: (P) 0-No pain   Pain Goal: Patients Stated Pain Goal: 2 (06/26/20 2563)                 [facility] Records

## 2020-06-26 NOTE — Lactation Note (Signed)
This note was copied from a baby's chart. 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   Code Apgar called for suspected respiratory distress.  Baby's face is significantly bruised, O2 sats WNL, RR WNL. Baby sleeping on Mom's chest.  Mom denies any feeding cues.  Demonstrated breast massage and hand expression, glistening of colostrum noted.  LC left baby STS on Mom's chest sleeping.  Mom is encouraged to keep baby STS and watch for cues Mom to call prn for assistance..     Interventions Interventions: Breast feeding basics reviewed;Skin to skin;Breast massage;Hand express;Support pillows  Lactation Tools Discussed/Used WIC Program: No   Consult Status Consult Status: Follow-up Date: 06/27/20 Follow-up type: In-patient    Erika Vasquez 06/26/2020, 10:57 AM

## 2020-06-26 NOTE — Plan of Care (Signed)
Pt demonstrated understanding 

## 2020-06-26 NOTE — H&P (Signed)
Erika Vasquez is a 30 y.o. female G2P1001 at 31 0/7 weeks (EDD 07/17/20 by LMP c/w 9 week [place]) presenting for IOL due to issues with velamentous insertion of cord, followed by MFM. Patient has early placenta previa that resolved on f/u scan but there was some question of a vasa previa at 31 weeks, but this was r/o by MFM and just a velamentous noted with normal growth. She had a h/o preterm labor with G1 but ultimately delivered at term. She had a normal cervical length at 18 weeks with this pregnancy.   OB History    Gravida  2   Para  1   Term  1   Preterm      AB      Living  1     SAB      IAB      Ectopic      Multiple  0   Live Births  1         11-22-2017, 40 wks 1. F, 9lbs 4oz, Vaginal Delivery  Past Medical History:  Diagnosis Date  . Anemia   . Headache   . Preterm labor    Past Surgical History:  Procedure Laterality Date  . WISDOM TOOTH EXTRACTION  09/2014   Family History: family history includes Anxiety disorder in her father; Cancer in her father; Depression in her father; Hypertension in her mother; Miscarriages / Stillbirths in her mother; Obesity in her father. Social History:  reports that she has never smoked. She has never used smokeless tobacco. She reports that she does not drink alcohol and does not use drugs.     Maternal Diabetes: No Genetic Screening: Normal Maternal Ultrasounds/Referrals: Other: Fetal Ultrasounds or other Referrals:  Referred to Materal Fetal Medicine  Maternal Substance Abuse:  No Significant Maternal Medications:  none Significant Maternal Lab Results:  Group B Strep negative Other Comments:  None  Review of Systems  Constitutional: Positive for fatigue. Negative for fever.  Gastrointestinal: Negative for abdominal pain.   Maternal Medical History:  Contractions: Frequency: irregular.   Perceived severity is mild.    Fetal activity: Perceived fetal activity is normal.    Prenatal complications:  velamentous insertion of cord-no vasa previa or placenta previa H/o preterm labor   Prenatal Complications - Diabetes: none.   FHR 130-140 category 1 and good variablity Blood pressure 104/63, pulse 74, temperature 97.9 F (36.6 C), temperature source Oral, resp. rate 18, height 5\' 7"  (1.702 m), weight 87.8 kg, unknown if currently breastfeeding. Maternal Exam:  Uterine Assessment: Contraction strength is mild.  Contraction frequency is irregular.   Abdomen: Patient reports no abdominal tenderness. Fetal presentation: vertex  Introitus: Normal vulva. Normal vagina.  Pelvis: adequate for delivery.      Physical Exam Cardiovascular:     Rate and Rhythm: Normal rate and regular rhythm.  Pulmonary:     Effort: Pulmonary effort is normal.  Abdominal:     Palpations: Abdomen is soft.  Genitourinary:    General: Normal vulva.  Neurological:     Mental Status: She is alert.  Psychiatric:        Mood and Affect: Mood normal.   Cervix 80/3-4/BBOW  Prenatal labs: ABO, Rh: --/--/O POS (01/29 0010) Antibody: NEG (01/29 0010) Rubella: Immune (07/20 0000) RPR:   NR HBsAg: Negative (07/20 0000)  HIV: Non-reactive (07/20 0000)  GBS:   Neg HgbAA One hour GCT 116 NIPT low risk  Assessment/Plan: Pt admitted and received cytotec x 1, now getting  uncomfortable.  She had some bloody show that scared her given events with placenta.  Reassured her FHR normal and bleeding just seems to be bloody show.  Advised to go ahead with epidural and we will AROM after that.  Pelvis tested to 9+cm.  Oliver Pila 06/26/2020, 5:59 AM

## 2020-06-26 NOTE — Anesthesia Procedure Notes (Signed)
Epidural Patient location during procedure: OB Start time: 06/26/2020 7:15 AM End time: 06/26/2020 7:22 AM  Staffing Anesthesiologist: Lannie Fields, DO Performed: anesthesiologist   Preanesthetic Checklist Completed: patient identified, IV checked, risks and benefits discussed, monitors and equipment checked, pre-op evaluation and timeout performed  Epidural Patient position: sitting Prep: DuraPrep and site prepped and draped Patient monitoring: continuous pulse ox, blood pressure, heart rate and cardiac monitor Approach: midline Location: L3-L4 Injection technique: LOR air  Needle:  Needle type: Tuohy  Needle gauge: 17 G Needle length: 9 cm Needle insertion depth: 7 cm Catheter type: closed end flexible Catheter size: 19 Gauge Catheter at skin depth: 12 cm Test dose: negative  Assessment Sensory level: T8 Events: blood not aspirated, injection not painful, no injection resistance, no paresthesia and negative IV test  Additional Notes Patient identified. Risks/Benefits/Options discussed with patient including but not limited to bleeding, infection, nerve damage, paralysis, failed block, incomplete pain control, headache, blood pressure changes, nausea, vomiting, reactions to medication both or allergic, itching and postpartum back pain. Confirmed with bedside nurse the patient's most recent platelet count. Confirmed with patient that they are not currently taking any anticoagulation, have any bleeding history or any family history of bleeding disorders. Patient expressed understanding and wished to proceed. All questions were answered. Sterile technique was used throughout the entire procedure. Please see nursing notes for vital signs. Test dose was given through epidural catheter and negative prior to continuing to dose epidural or start infusion. Warning signs of high block given to the patient including shortness of breath, tingling/numbness in hands, complete motor  block, or any concerning symptoms with instructions to call for help. Patient was given instructions on fall risk and not to get out of bed. All questions and concerns addressed with instructions to call with any issues or inadequate analgesia.  Reason for block:procedure for pain

## 2020-06-26 NOTE — Lactation Note (Signed)
This note was copied from a baby's chart. Lactation Consultation Note  Patient Name: Erika Vasquez OOILN'Z Date: 06/26/2020   Age:32 hours   LC Follow Up Visit:  No order placed for lactation services.  Asked RN to check with mother and mother declined lactation services.  She stated that, if she desires a consult, she will let her RN know.   Maternal Data    Feeding Feeding Type: Breast Fed  LATCH Score                   Interventions    Lactation Tools Discussed/Used     Consult Status      Erika Vasquez 06/26/2020, 3:42 PM

## 2020-06-26 NOTE — Progress Notes (Signed)
Patient ID: Erika Vasquez, female   DOB: [birth year], 30 y.o.   MRN: [id number] Pt comfortable with epidural. Has no complaints. +Fms VSS GEN - NAD EFM - 120s, cat 1 TOCO - ctxs q2-76mins SVE - 6/90/-2  A/P: G2P1001 at 37 0/7wks with ? Velamentous cord insertion.          - progressing well on 4u pitocin          - AROM with clear but blood tinged amniotic fluid         - expectant mgmt ; anticipate svd

## 2020-06-27 LAB — CBC
HCT: 22.4 % — ABNORMAL LOW (ref 36.0–46.0)
Hemoglobin: 7.3 g/dL — ABNORMAL LOW (ref 12.0–15.0)
MCH: 28.5 pg (ref 26.0–34.0)
MCHC: 32.6 g/dL (ref 30.0–36.0)
MCV: 87.5 fL (ref 80.0–100.0)
Platelets: 194 10*3/uL (ref 150–400)
RBC: 2.56 MIL/uL — ABNORMAL LOW (ref 3.87–5.11)
RDW: 13.7 % (ref 11.5–15.5)
WBC: 16.3 10*3/uL — ABNORMAL HIGH (ref 4.0–10.5)
nRBC: 0 % (ref 0.0–0.2)

## 2020-06-27 MED ORDER — IBUPROFEN 600 MG PO TABS: 600.0000 mg | ORAL_TABLET | Freq: Four times a day (QID) | ORAL | 0 refills | Status: DC

## 2020-06-27 NOTE — Discharge Summary (Signed)
Postpartum Discharge Summary       Patient Name: Erika Vasquez DOB: 1991/01/17 MRN: [id number]  Date of admission: 06/26/2020 Delivery date:06/26/2020  Delivering provider: Pryor Ochoa [facility]  Date of discharge: 06/27/2020  Admitting diagnosis: Pregnancy [Z34.90] Intrauterine pregnancy: [redacted]w[redacted]d     Secondary diagnosis:  Active Problems:   Pregnancy   Vaginal delivery  Additional problems: Velamentous insertion of cord    Discharge diagnosis: Term Pregnancy Delivered                                              Post partum procedures:none Augmentation: AROM, Pitocin and Cytotec Complications: None  Hospital course: Induction of Labor With Vaginal Delivery   30 y.o. yo G2P2002 at [redacted]w[redacted]d was admitted to the hospital 06/26/2020 for induction of labor.  Indication for induction: velamentous cord insertion.  Patient had an uncomplicated labor course as follows: Membrane Rupture Time/Date: 8:53 AM ,06/26/2020   Delivery Method:Vaginal, Spontaneous  Episiotomy: None  Lacerations:  1st degree;Periurethral  Details of delivery can be found in separate delivery note.  Patient had a routine postpartum course. Patient is discharged home 06/27/20.  Newborn Data: Birth date:06/26/2020  Birth time:9:55 AM  Gender:Female  Living status:Living  Apgars:8 ,9  Weight:3370 g   Magnesium Sulfate received: No BMZ received: No Rhophylac:No   Physical exam  Vitals:   06/26/20 1348 06/26/20 1830 06/26/20 2100 06/27/20 0205  BP: 125/74 121/72 115/72 114/76  Pulse: 70  71 77  Resp: 18 18 16 18   Temp: 98 F (36.7 C) 97.9 F (36.6 C) 97.7 F (36.5 C) 97.9 F (36.6 C)  TempSrc:   Oral Oral  SpO2:   98% 96%  Weight:      Height:       General: alert and cooperative Lochia: appropriate Uterine Fundus: firm  Labs: Lab Results  Component Value Date   WBC 16.3 (H) 06/27/2020   HGB 7.3 (L) 06/27/2020   HCT 22.4 (L) 06/27/2020   MCV 87.5 06/27/2020   PLT 194 06/27/2020   CMP  Latest Ref Rng & Units 11/24/2014  Glucose 65 - 99 mg/dL 84  BUN 6 - 20 mg/dL 16  Creatinine 11/26/2014 - 4.25 mg/dL 9.56  Sodium 3.87 - 564 mmol/L 142  Potassium 3.5 - 5.2 mmol/L 4.0  Chloride 97 - 108 mmol/L 103  CO2 18 - 29 mmol/L 22  Calcium 8.7 - 10.2 mg/dL 9.3  Total Protein 6.0 - 8.5 g/dL 6.6  Total Bilirubin 0.0 - 1.2 mg/dL 0.4  Alkaline Phos 39 - 117 IU/L 66  AST 0 - 40 IU/L 20  ALT 0 - 32 IU/L 13   Edinburgh Score: Edinburgh Postnatal Depression Scale Screening Tool 11/23/2017  I have been able to laugh and see the funny side of things. 0  I have looked forward with enjoyment to things. 0  I have blamed myself unnecessarily when things went wrong. 2  I have been anxious or worried for no good reason. 1  I have felt scared or panicky for no good reason. 1  Things have been getting on top of me. 1  I have been so unhappy that I have had difficulty sleeping. 1  I have felt sad or miserable. 0  I have been so unhappy that I have been crying. 0  The thought of harming myself has occurred to me. 0  Edinburgh Postnatal Depression Scale Total 6     After visit meds:  Allergies as of 06/27/2020   No Known Allergies     Medication List    STOP taking these medications   BONJESTA PO     TAKE these medications   acetaminophen 325 MG tablet Commonly known as: Tylenol Take 2 tablets (650 mg total) by mouth every 4 (four) hours as needed (for pain scale < 4).   ibuprofen 600 MG tablet Commonly known as: ADVIL Take 1 tablet (600 mg total) by mouth every 6 (six) hours.   PRENATAL VITAMINS PO Take by mouth.        Discharge home in stable condition Infant Feeding: Breast Infant Disposition:home with mother Discharge instruction: per After Visit Summary and Postpartum booklet. Activity: Advance as tolerated. Pelvic rest for 6 weeks.  Diet: routine diet Future Appointments:No future appointments. Follow up Visit:  Follow-up Information    Edwinna Areola, DO.  Schedule an appointment as soon as possible for a visit in 6 week(s).   Specialty: Obstetrics and Gynecology Why: postpartum Contact information: [address] [place] [address] [place] [address] [phone]                Please schedule this patient for a In person postpartum visit in 6 weeks with the following provider: MD.  Delivery mode:  Vaginal, Spontaneous  Anticipated Birth Control:  POPs   06/27/2020 Oliver Pila, MD

## 2020-06-27 NOTE — Progress Notes (Addendum)
Post Partum Day 1 Subjective: no complaints, up ad lib and tolerating PO   States baby nursing well.  No dizziness or issues ambulating.  Desires early d/c today  Objective: Blood pressure 114/76, pulse 77, temperature 97.9 F (36.6 C), temperature source Oral, resp. rate 18, height 5\' 7"  (1.702 m), weight 87.8 kg, SpO2 96 %, unknown if currently breastfeeding.  Physical Exam:  General: alert and cooperative Lochia: appropriate Uterine Fundus: firm   Recent Labs    06/26/20 0010 06/27/20 0437  HGB 9.0* 7.3*  HCT 28.5* 22.4*    Assessment/Plan: Discharge home if baby able to go Tolerating relative anemia well--po iron  Circumcision d/w pt and performed F/u 6 weeks   LOS: 1 day   06/29/20 06/27/2020, 11:00 AM

## 2020-06-28 ENCOUNTER — Inpatient Hospital Stay (HOSPITAL_COMMUNITY): Admit: 2020-06-28 | Payer: 59 | Admitting: Obstetrics and Gynecology

## 2020-06-28 ENCOUNTER — Encounter (HOSPITAL_COMMUNITY): Payer: Self-pay

## 2020-06-28 SURGERY — Surgical Case
Anesthesia: Spinal

## 2020-06-29 ENCOUNTER — Ambulatory Visit: Payer: 59 | Admitting: Podiatry

## 2020-06-29 LAB — SURGICAL PATHOLOGY

## 2020-08-03 ENCOUNTER — Ambulatory Visit: Payer: 59 | Admitting: Podiatry

## 2020-08-03 ENCOUNTER — Other Ambulatory Visit: Payer: Self-pay

## 2020-08-03 DIAGNOSIS — B070 Plantar wart: Secondary | ICD-10-CM | POA: Diagnosis not present

## 2020-08-17 DIAGNOSIS — Z3009 Encounter for other general counseling and advice on contraception: Secondary | ICD-10-CM | POA: Diagnosis not present

## 2020-08-17 DIAGNOSIS — Z1389 Encounter for screening for other disorder: Secondary | ICD-10-CM | POA: Diagnosis not present

## 2020-08-17 DIAGNOSIS — Z30013 Encounter for initial prescription of injectable contraceptive: Secondary | ICD-10-CM | POA: Diagnosis not present

## 2020-08-24 ENCOUNTER — Other Ambulatory Visit: Payer: Self-pay

## 2020-08-24 ENCOUNTER — Ambulatory Visit: Payer: 59 | Admitting: Podiatry

## 2020-08-24 DIAGNOSIS — B070 Plantar wart: Secondary | ICD-10-CM | POA: Diagnosis not present

## 2020-08-26 NOTE — Progress Notes (Signed)
  Subjective:  Patient ID: Erika Vasquez, female    DOB: 09/22/1990,  MRN: [id number]  Chief Complaint  Patient presents with  . Follow-up    3 wk f/u verruca plantaris     30 y.o. female presents with the above complaint. Thinks the lesions are doing better but not resolved.  Objective:  Physical Exam: warm, good capillary refill, no trophic changes or ulcerative lesions, normal DP and PT pulses and normal sensory exam. Right Foot: verruca with blistering right 1st MPJ, petechiae   Assessment:   1. Verruca plantaris    Plan:  Patient was evaluated and treated and all questions answered.  Verruca plantaris -Repeat laser treatment to destroy lesions  Procedure: Destruction of Lesion Location: right midfoot Anesthesia: none Instrumentation: 15 blade, YAG laser Technique: Debridement of lesion , followed by several passes of Yag laser to destroy lesion.    No follow-ups on file.

## 2020-08-26 NOTE — Progress Notes (Signed)
  Subjective:  Patient ID: Georg Ruddle, female    DOB: 1991/03/05,  MRN: [id number]  Chief Complaint  Patient presents with  . Plantar Warts    Put in laser room 3w f/u    30 y.o. female presents with the above complaint.  States lesions doing much better.  Objective:  Physical Exam: warm, good capillary refill, no trophic changes or ulcerative lesions, normal DP and PT pulses and normal sensory exam. Right Foot: Resolving verruca bilaterally still with some plaques of plantar medial first metatarsal area  Assessment:   1. Verruca plantaris    Plan:  Patient was evaluated and treated and all questions answered.  Verruca plantaris -Almost completely resolved.  Destroyed again today hopefully this will be her last treatment necessary  Procedure: Destruction of Lesion Location: bilateral forefoot Anesthesia: none Instrumentation: 15 blade, YAG laser Technique: Debridement of lesion , followed by several passes of Yag laser to destroy lesion.  Return in about 3 weeks (around 09/14/2020).

## 2020-09-14 ENCOUNTER — Ambulatory Visit: Payer: 59 | Admitting: Podiatry

## 2020-09-17 ENCOUNTER — Ambulatory Visit (INDEPENDENT_AMBULATORY_CARE_PROVIDER_SITE_OTHER): Payer: 59 | Admitting: Podiatry

## 2020-09-17 ENCOUNTER — Other Ambulatory Visit: Payer: Self-pay

## 2020-09-17 DIAGNOSIS — B070 Plantar wart: Secondary | ICD-10-CM

## 2020-09-17 NOTE — Progress Notes (Signed)
   Subjective:    Patient ID: Erika Vasquez, female    DOB: Jul 13, 1990, 30 y.o.   MRN: [id number]  Courtesy look at patient's foot while she was here with her husband - verruca appears resolved no treatment indicated today. F/u should issues persist.

## 2020-09-23 ENCOUNTER — Other Ambulatory Visit: Payer: Self-pay

## 2020-09-23 ENCOUNTER — Ambulatory Visit: Payer: 59 | Admitting: Nurse Practitioner

## 2020-09-23 ENCOUNTER — Encounter: Payer: Self-pay | Admitting: Nurse Practitioner

## 2020-09-23 ENCOUNTER — Ambulatory Visit (INDEPENDENT_AMBULATORY_CARE_PROVIDER_SITE_OTHER): Payer: 59

## 2020-09-23 VITALS — BP 118/79 | HR 86 | Temp 98.9°F | Resp 20 | Ht 67.0 in | Wt 176.0 lb

## 2020-09-23 DIAGNOSIS — M546 Pain in thoracic spine: Secondary | ICD-10-CM

## 2020-09-23 DIAGNOSIS — R220 Localized swelling, mass and lump, head: Secondary | ICD-10-CM | POA: Diagnosis not present

## 2020-09-23 MED ORDER — PREDNISONE 20 MG PO TABS: 40.0000 mg | ORAL_TABLET | Freq: Every day | ORAL | 0 refills | Status: AC

## 2020-09-23 NOTE — Progress Notes (Signed)
   Subjective:    Patient ID: Erika Vasquez, female    DOB: 1990/12/14, 30 y.o.   MRN: [id number]   Chief Complaint: Back Pain (Mid back and radiating to shoulder and causing numbness/)   HPI Patient comes in to day c/o low back pain. Says she gets a knot in her back and then gets tingling sensation that radiates upward. Gets numb and last anywhere from 1-2 hours to just a few minutes. Started 2 weeks ago. Occurs 3-4 x a day.says it is not a pain, just a numbness.  Review of Systems  Constitutional: Negative for diaphoresis.  Eyes: Negative for pain.  Respiratory: Negative for shortness of breath.   Cardiovascular: Negative for chest pain, palpitations and leg swelling.  Gastrointestinal: Negative for abdominal pain.  Endocrine: Negative for polydipsia.  Skin: Negative for rash.  Neurological: Negative for dizziness, weakness and headaches.  Hematological: Does not bruise/bleed easily.  All other systems reviewed and are negative.      Objective:   Physical Exam Vitals and nursing note reviewed.  Constitutional:      Appearance: Normal appearance.  Cardiovascular:     Rate and Rhythm: Normal rate and regular rhythm.     Heart sounds: Normal heart sounds.  Pulmonary:     Effort: Pulmonary effort is normal.     Breath sounds: Normal breath sounds.  Musculoskeletal:        General: Normal range of motion.  Skin:    General: Skin is warm.  Neurological:     General: No focal deficit present.     Mental Status: She is alert and oriented to person, place, and time.     BP 118/79   Pulse 86   Temp 98.9 F (37.2 C) (Temporal)   Resp 20   Ht 5\' 7"  (1.702 m)   Wt 176 lb (79.8 kg)   SpO2 100%   BMI 27.57 kg/m   Thoracic xrya- normal appearing thoracic spine.-Preliminary reading by , FNP  [facility]      Assessment & Plan:  [facility] [place] in today with chief complaint of Back Pain (Mid back and radiating to shoulder and causing numbness/)   1.  Acute midline thoracic back pain Keep diary of episodes If does not improve let me know. - DG Thoracic Spine 2 View  Meds ordered this encounter  Medications  . predniSONE (DELTASONE) 20 MG tablet    Sig: Take 2 tablets (40 mg total) by mouth daily with breakfast for 5 days. 2 po daily for 5 days    Dispense:  10 tablet    Refill:  0    Order Specific Question:   Supervising Provider    Answer:   Marius Ditch A [[id number]]     The above assessment and management plan was discussed with the patient. The patient verbalized understanding of and has agreed to the management plan. Patient is aware to call the clinic if symptoms persist or worsen. Patient is aware when to return to the clinic for a follow-up visit. Patient educated on when it is appropriate to go to the emergency department.   Mary-Margaret Arville Care, FNP

## 2020-09-27 ENCOUNTER — Other Ambulatory Visit: Payer: Self-pay | Admitting: Nurse Practitioner

## 2020-09-27 DIAGNOSIS — M546 Pain in thoracic spine: Secondary | ICD-10-CM

## 2020-09-27 NOTE — Progress Notes (Signed)
refref

## 2020-10-21 DIAGNOSIS — M542 Cervicalgia: Secondary | ICD-10-CM | POA: Diagnosis not present

## 2020-11-12 DIAGNOSIS — M542 Cervicalgia: Secondary | ICD-10-CM | POA: Diagnosis not present

## 2020-11-12 DIAGNOSIS — M9903 Segmental and somatic dysfunction of lumbar region: Secondary | ICD-10-CM | POA: Diagnosis not present

## 2020-11-12 DIAGNOSIS — M9902 Segmental and somatic dysfunction of thoracic region: Secondary | ICD-10-CM | POA: Diagnosis not present

## 2020-11-12 DIAGNOSIS — M546 Pain in thoracic spine: Secondary | ICD-10-CM | POA: Diagnosis not present

## 2020-11-12 DIAGNOSIS — M9901 Segmental and somatic dysfunction of cervical region: Secondary | ICD-10-CM | POA: Diagnosis not present

## 2020-11-15 DIAGNOSIS — Z3044 Encounter for surveillance of vaginal ring hormonal contraceptive device: Secondary | ICD-10-CM | POA: Diagnosis not present

## 2020-11-15 DIAGNOSIS — Z130 Encounter for screening for diseases of the blood and blood-forming organs and certain disorders involving the immune mechanism: Secondary | ICD-10-CM | POA: Diagnosis not present

## 2020-11-15 DIAGNOSIS — Z01419 Encounter for gynecological examination (general) (routine) without abnormal findings: Secondary | ICD-10-CM | POA: Diagnosis not present

## 2020-11-15 DIAGNOSIS — Z124 Encounter for screening for malignant neoplasm of cervix: Secondary | ICD-10-CM | POA: Diagnosis not present

## 2020-11-15 DIAGNOSIS — Z6828 Body mass index (BMI) 28.0-28.9, adult: Secondary | ICD-10-CM | POA: Diagnosis not present

## 2020-11-15 DIAGNOSIS — Z1389 Encounter for screening for other disorder: Secondary | ICD-10-CM | POA: Diagnosis not present

## 2020-11-16 DIAGNOSIS — Z124 Encounter for screening for malignant neoplasm of cervix: Secondary | ICD-10-CM | POA: Diagnosis not present

## 2020-11-16 DIAGNOSIS — R87610 Atypical squamous cells of undetermined significance on cytologic smear of cervix (ASC-US): Secondary | ICD-10-CM | POA: Diagnosis not present

## 2020-11-17 DIAGNOSIS — M9902 Segmental and somatic dysfunction of thoracic region: Secondary | ICD-10-CM | POA: Diagnosis not present

## 2020-11-17 DIAGNOSIS — M9903 Segmental and somatic dysfunction of lumbar region: Secondary | ICD-10-CM | POA: Diagnosis not present

## 2020-11-17 DIAGNOSIS — M542 Cervicalgia: Secondary | ICD-10-CM | POA: Diagnosis not present

## 2020-11-17 DIAGNOSIS — M546 Pain in thoracic spine: Secondary | ICD-10-CM | POA: Diagnosis not present

## 2020-11-17 DIAGNOSIS — M9901 Segmental and somatic dysfunction of cervical region: Secondary | ICD-10-CM | POA: Diagnosis not present

## 2020-11-19 DIAGNOSIS — M542 Cervicalgia: Secondary | ICD-10-CM | POA: Diagnosis not present

## 2020-11-19 DIAGNOSIS — M9903 Segmental and somatic dysfunction of lumbar region: Secondary | ICD-10-CM | POA: Diagnosis not present

## 2020-11-19 DIAGNOSIS — M9902 Segmental and somatic dysfunction of thoracic region: Secondary | ICD-10-CM | POA: Diagnosis not present

## 2020-11-19 DIAGNOSIS — M546 Pain in thoracic spine: Secondary | ICD-10-CM | POA: Diagnosis not present

## 2020-11-19 DIAGNOSIS — M9901 Segmental and somatic dysfunction of cervical region: Secondary | ICD-10-CM | POA: Diagnosis not present

## 2020-11-22 DIAGNOSIS — M9903 Segmental and somatic dysfunction of lumbar region: Secondary | ICD-10-CM | POA: Diagnosis not present

## 2020-11-22 DIAGNOSIS — M9901 Segmental and somatic dysfunction of cervical region: Secondary | ICD-10-CM | POA: Diagnosis not present

## 2020-11-22 DIAGNOSIS — M542 Cervicalgia: Secondary | ICD-10-CM | POA: Diagnosis not present

## 2020-11-22 DIAGNOSIS — M546 Pain in thoracic spine: Secondary | ICD-10-CM | POA: Diagnosis not present

## 2020-11-22 DIAGNOSIS — M9902 Segmental and somatic dysfunction of thoracic region: Secondary | ICD-10-CM | POA: Diagnosis not present

## 2020-11-24 DIAGNOSIS — M9902 Segmental and somatic dysfunction of thoracic region: Secondary | ICD-10-CM | POA: Diagnosis not present

## 2020-11-24 DIAGNOSIS — M546 Pain in thoracic spine: Secondary | ICD-10-CM | POA: Diagnosis not present

## 2020-11-24 DIAGNOSIS — M9903 Segmental and somatic dysfunction of lumbar region: Secondary | ICD-10-CM | POA: Diagnosis not present

## 2020-11-24 DIAGNOSIS — M9901 Segmental and somatic dysfunction of cervical region: Secondary | ICD-10-CM | POA: Diagnosis not present

## 2020-11-24 DIAGNOSIS — M542 Cervicalgia: Secondary | ICD-10-CM | POA: Diagnosis not present

## 2020-12-03 DIAGNOSIS — M9901 Segmental and somatic dysfunction of cervical region: Secondary | ICD-10-CM | POA: Diagnosis not present

## 2020-12-03 DIAGNOSIS — M542 Cervicalgia: Secondary | ICD-10-CM | POA: Diagnosis not present

## 2020-12-03 DIAGNOSIS — M9903 Segmental and somatic dysfunction of lumbar region: Secondary | ICD-10-CM | POA: Diagnosis not present

## 2020-12-03 DIAGNOSIS — M546 Pain in thoracic spine: Secondary | ICD-10-CM | POA: Diagnosis not present

## 2020-12-03 DIAGNOSIS — M9902 Segmental and somatic dysfunction of thoracic region: Secondary | ICD-10-CM | POA: Diagnosis not present

## 2020-12-17 DIAGNOSIS — M542 Cervicalgia: Secondary | ICD-10-CM | POA: Diagnosis not present

## 2020-12-17 DIAGNOSIS — M9901 Segmental and somatic dysfunction of cervical region: Secondary | ICD-10-CM | POA: Diagnosis not present

## 2020-12-17 DIAGNOSIS — M546 Pain in thoracic spine: Secondary | ICD-10-CM | POA: Diagnosis not present

## 2020-12-17 DIAGNOSIS — M9902 Segmental and somatic dysfunction of thoracic region: Secondary | ICD-10-CM | POA: Diagnosis not present

## 2020-12-17 DIAGNOSIS — M9903 Segmental and somatic dysfunction of lumbar region: Secondary | ICD-10-CM | POA: Diagnosis not present

## 2021-01-05 DIAGNOSIS — M542 Cervicalgia: Secondary | ICD-10-CM | POA: Diagnosis not present

## 2021-01-05 DIAGNOSIS — M9903 Segmental and somatic dysfunction of lumbar region: Secondary | ICD-10-CM | POA: Diagnosis not present

## 2021-01-05 DIAGNOSIS — M546 Pain in thoracic spine: Secondary | ICD-10-CM | POA: Diagnosis not present

## 2021-01-05 DIAGNOSIS — M9901 Segmental and somatic dysfunction of cervical region: Secondary | ICD-10-CM | POA: Diagnosis not present

## 2021-01-05 DIAGNOSIS — M9902 Segmental and somatic dysfunction of thoracic region: Secondary | ICD-10-CM | POA: Diagnosis not present

## 2021-01-19 DIAGNOSIS — M9902 Segmental and somatic dysfunction of thoracic region: Secondary | ICD-10-CM | POA: Diagnosis not present

## 2021-01-19 DIAGNOSIS — M542 Cervicalgia: Secondary | ICD-10-CM | POA: Diagnosis not present

## 2021-01-19 DIAGNOSIS — M9903 Segmental and somatic dysfunction of lumbar region: Secondary | ICD-10-CM | POA: Diagnosis not present

## 2021-01-19 DIAGNOSIS — M546 Pain in thoracic spine: Secondary | ICD-10-CM | POA: Diagnosis not present

## 2021-01-19 DIAGNOSIS — M9901 Segmental and somatic dysfunction of cervical region: Secondary | ICD-10-CM | POA: Diagnosis not present

## 2021-02-16 DIAGNOSIS — M546 Pain in thoracic spine: Secondary | ICD-10-CM | POA: Diagnosis not present

## 2021-02-16 DIAGNOSIS — M9903 Segmental and somatic dysfunction of lumbar region: Secondary | ICD-10-CM | POA: Diagnosis not present

## 2021-02-16 DIAGNOSIS — M9902 Segmental and somatic dysfunction of thoracic region: Secondary | ICD-10-CM | POA: Diagnosis not present

## 2021-02-16 DIAGNOSIS — M9901 Segmental and somatic dysfunction of cervical region: Secondary | ICD-10-CM | POA: Diagnosis not present

## 2021-02-16 DIAGNOSIS — M542 Cervicalgia: Secondary | ICD-10-CM | POA: Diagnosis not present

## 2021-03-18 ENCOUNTER — Other Ambulatory Visit (HOSPITAL_BASED_OUTPATIENT_CLINIC_OR_DEPARTMENT_OTHER): Payer: Self-pay

## 2021-03-18 MED ORDER — INFLUENZA VAC SPLIT QUAD 0.5 ML IM SUSY
PREFILLED_SYRINGE | INTRAMUSCULAR | 0 refills | Status: DC
  Filled 2021-03-18: qty 0.5, 1d supply, fill #0

## 2021-04-13 DIAGNOSIS — M542 Cervicalgia: Secondary | ICD-10-CM | POA: Diagnosis not present

## 2021-04-13 DIAGNOSIS — M9901 Segmental and somatic dysfunction of cervical region: Secondary | ICD-10-CM | POA: Diagnosis not present

## 2021-04-13 DIAGNOSIS — M9903 Segmental and somatic dysfunction of lumbar region: Secondary | ICD-10-CM | POA: Diagnosis not present

## 2021-04-13 DIAGNOSIS — M546 Pain in thoracic spine: Secondary | ICD-10-CM | POA: Diagnosis not present

## 2021-04-13 DIAGNOSIS — M9902 Segmental and somatic dysfunction of thoracic region: Secondary | ICD-10-CM | POA: Diagnosis not present

## 2021-05-06 ENCOUNTER — Telehealth: Payer: 59 | Admitting: Physician Assistant

## 2021-05-06 DIAGNOSIS — J019 Acute sinusitis, unspecified: Secondary | ICD-10-CM

## 2021-05-06 DIAGNOSIS — B9689 Other specified bacterial agents as the cause of diseases classified elsewhere: Secondary | ICD-10-CM | POA: Diagnosis not present

## 2021-05-06 MED ORDER — AMOXICILLIN-POT CLAVULANATE 875-125 MG PO TABS: 1.0000 | ORAL_TABLET | Freq: Two times a day (BID) | ORAL | 0 refills | Status: DC

## 2021-05-06 NOTE — Progress Notes (Signed)

## 2021-05-10 IMAGING — US US OB COMP LESS 14 WK
1 series · 15 of 28 positions shown · non-contrast
Comparison: None.

CLINICAL DATA: Vaginal bleeding

EXAM:
OBSTETRIC <14 WK ULTRASOUND
TECHNIQUE: Transabdominal ultrasound was performed for evaluation of the
gestation as well as the maternal uterus and adnexal regions.

[Series 1: us ob comp less 14 wk · 42 acquisitions, 15 frames shown]
[im 1/42]
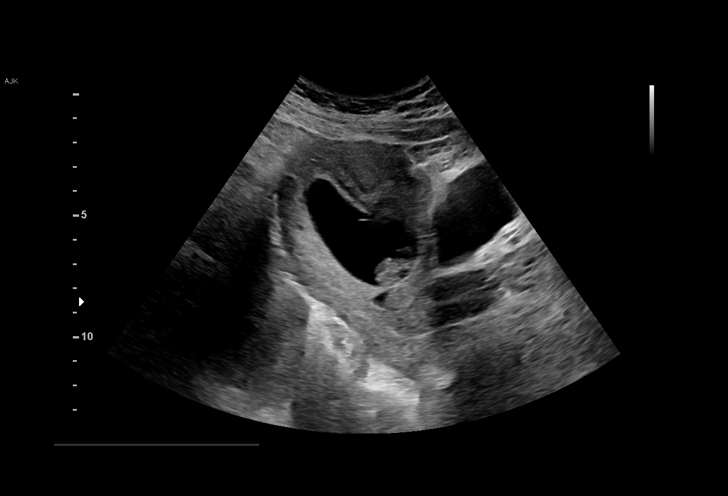
[im 4/42]
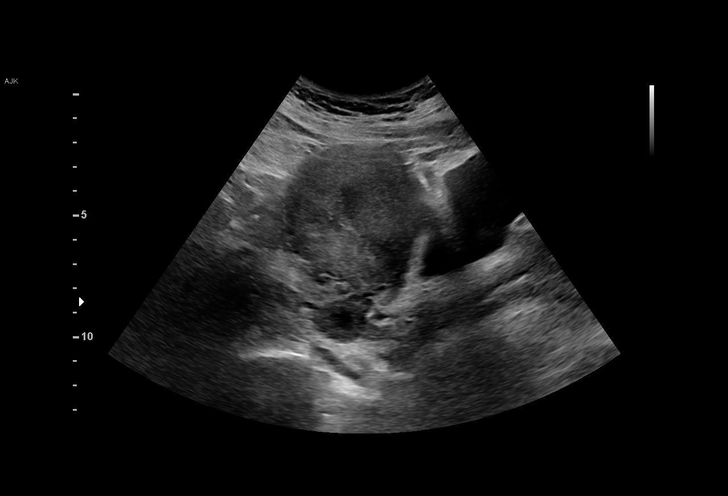
[im 7/42]
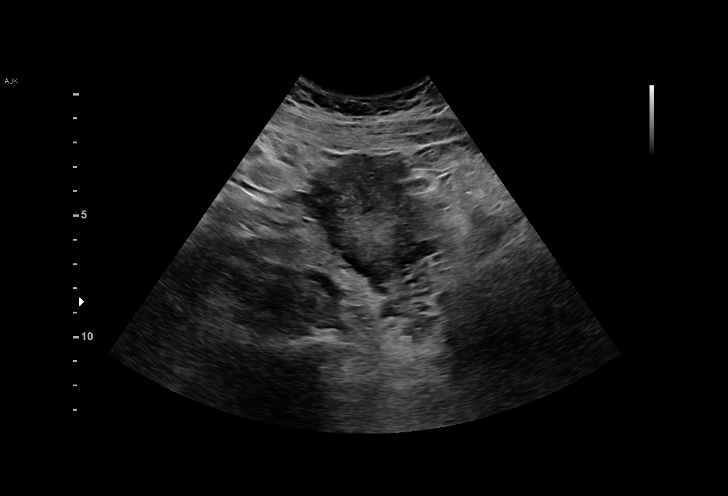
[im 10/42]
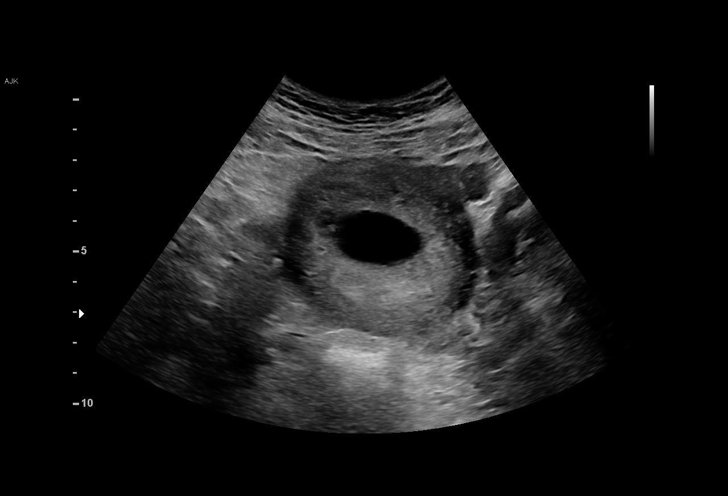
[im 13/42]
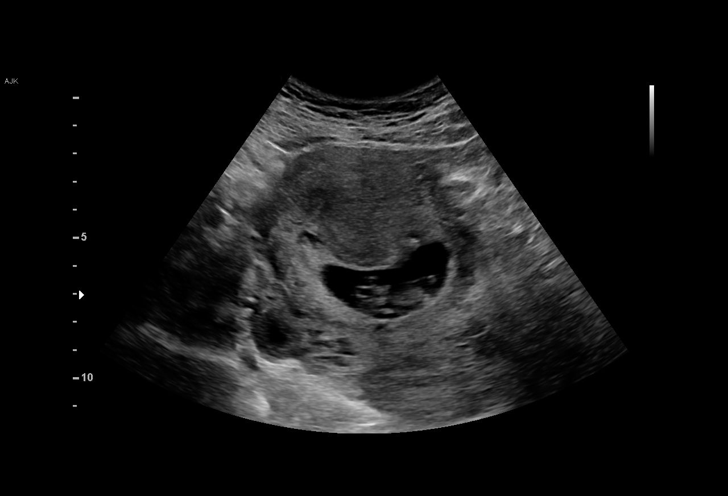
[im 16/42]
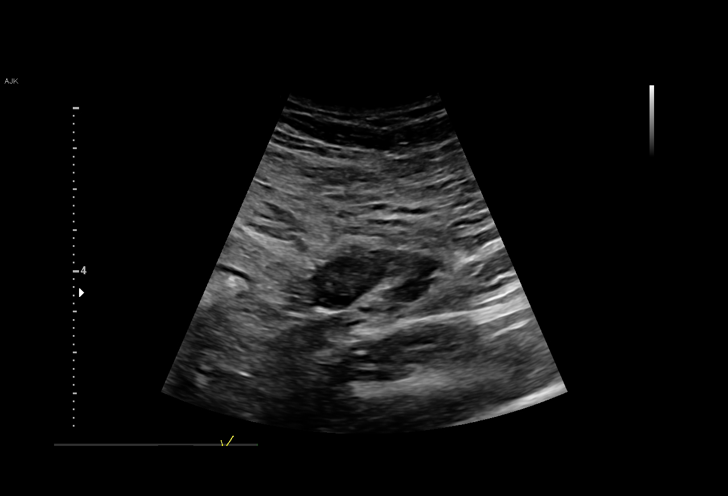
[im 19/42]
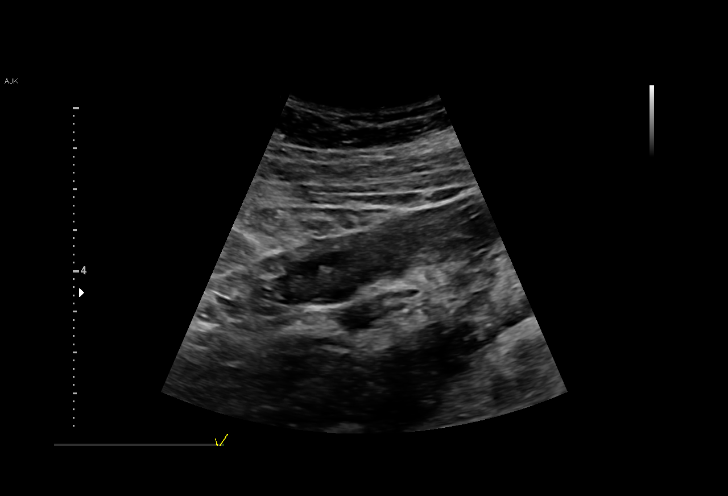
[im 22/42]
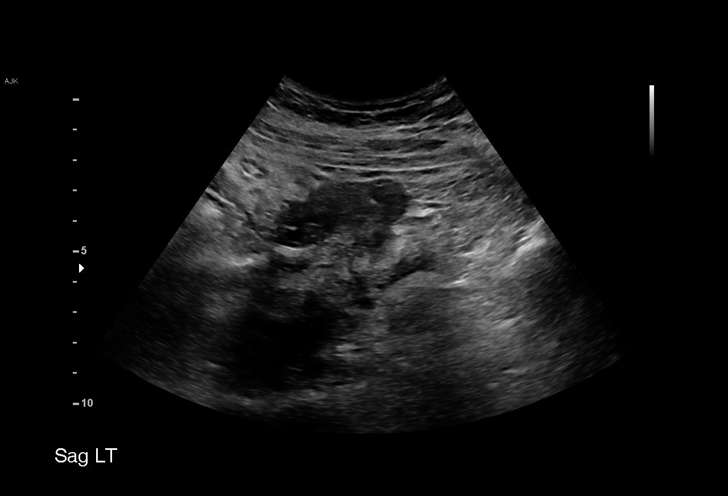
[im 23/42]
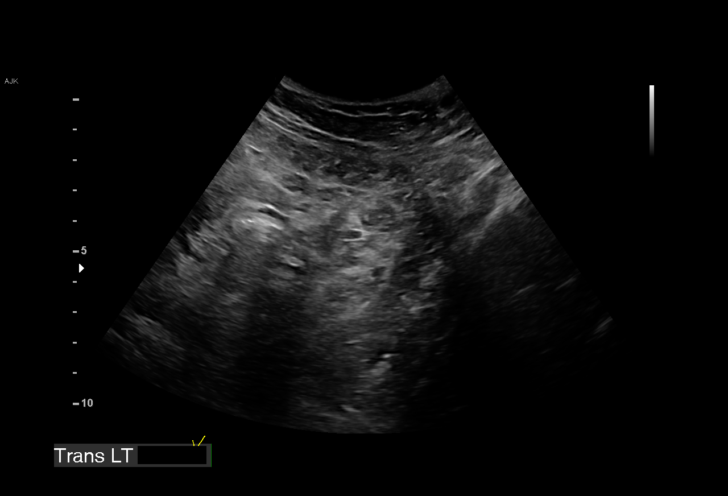
[im 26/42]
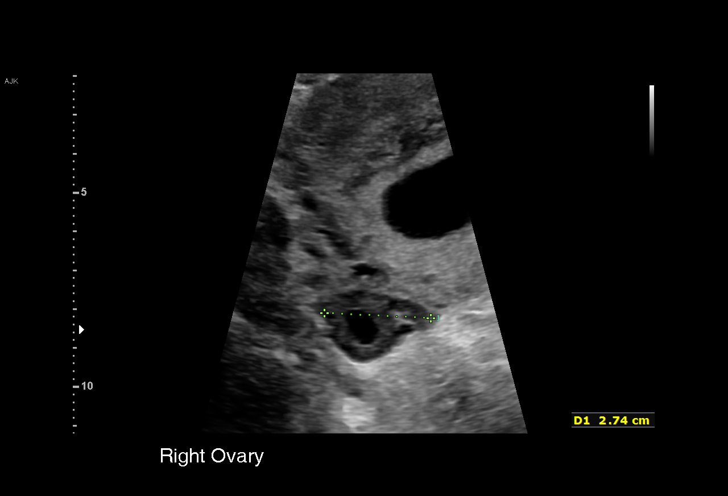
[im 29/42]
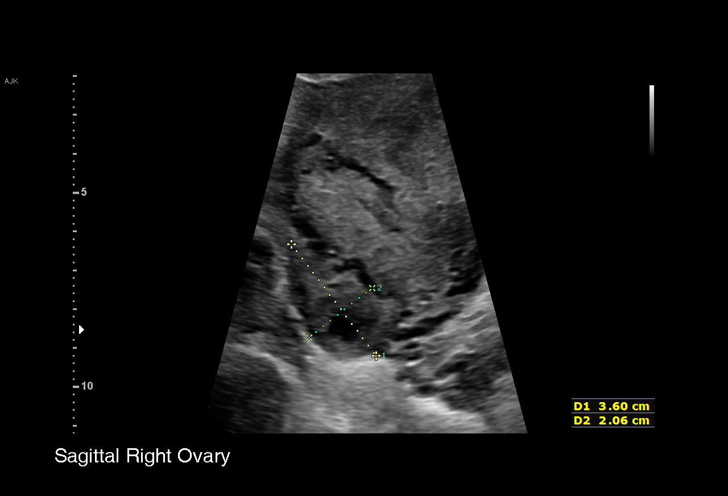
[im 32/42]
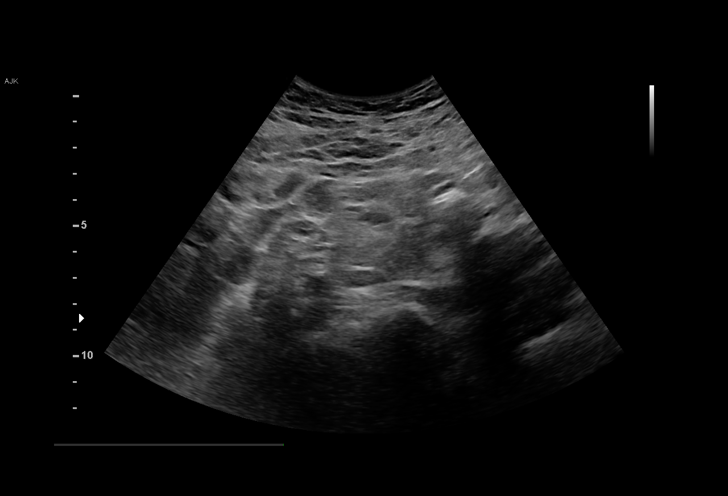
[im 35/42]
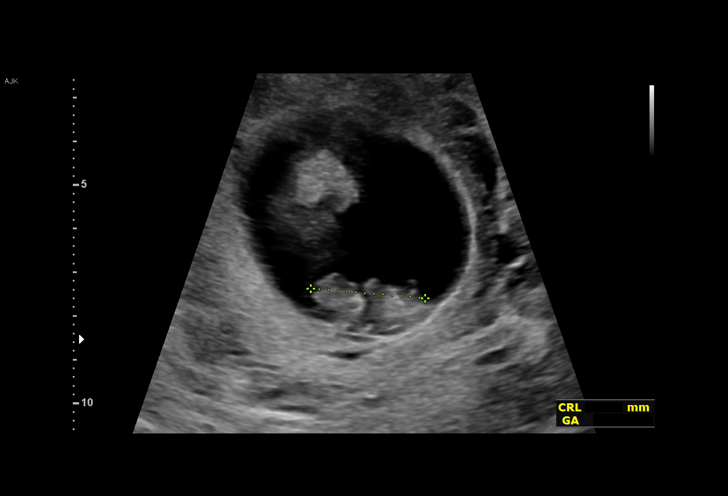
[im 38/42]
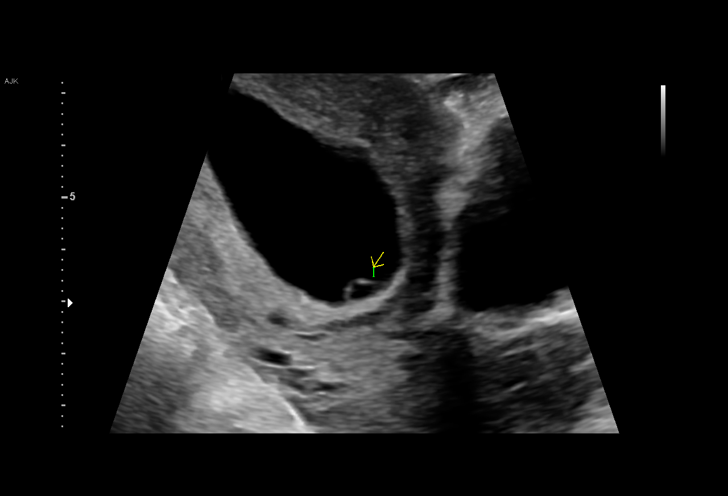
[im 42/42]
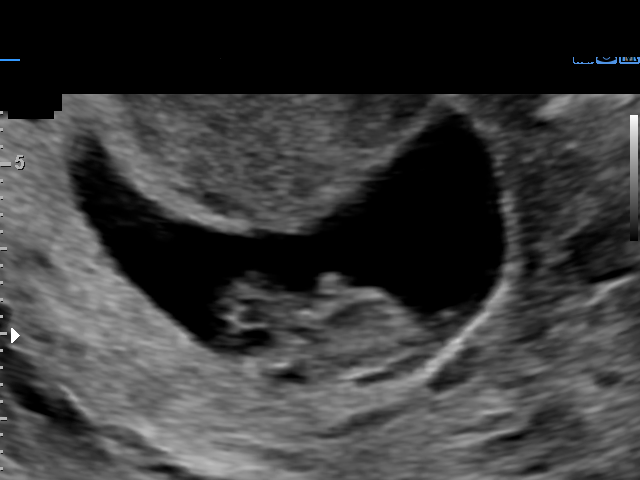

[15 of 28 positions shown; findings below may reference images not displayed]

FINDINGS: Intrauterine gestational sac: Single

Yolk sac:  Visualized.

Embryo:  Visualized.

Cardiac Activity: Visualized.

Heart Rate: 180 bpm

CRL: 26.4 mm   9 w 3 d                  US EDC: 07/21/2020

Subchorionic hemorrhage: Small subchorionic hemorrhage along the
inferior margin of the gestational sac.

Maternal uterus/adnexae: No uterine masses. Left ovary measures
x 3.6 x 1.3 cm. Right ovary measures 2.7 x 3.6 x 2.1 cm, with a
corpus luteum cyst noted. No pelvic free fluid.
IMPRESSION: 1. Single live intrauterine pregnancy as above, estimated age 9
weeks and 3 days.
2. Small subchorionic hemorrhage.

## 2021-07-06 DIAGNOSIS — M9903 Segmental and somatic dysfunction of lumbar region: Secondary | ICD-10-CM | POA: Diagnosis not present

## 2021-07-06 DIAGNOSIS — M546 Pain in thoracic spine: Secondary | ICD-10-CM | POA: Diagnosis not present

## 2021-07-06 DIAGNOSIS — M9901 Segmental and somatic dysfunction of cervical region: Secondary | ICD-10-CM | POA: Diagnosis not present

## 2021-07-06 DIAGNOSIS — M542 Cervicalgia: Secondary | ICD-10-CM | POA: Diagnosis not present

## 2021-07-06 DIAGNOSIS — M9902 Segmental and somatic dysfunction of thoracic region: Secondary | ICD-10-CM | POA: Diagnosis not present

## 2021-09-01 DIAGNOSIS — M542 Cervicalgia: Secondary | ICD-10-CM | POA: Diagnosis not present

## 2021-09-01 DIAGNOSIS — M9903 Segmental and somatic dysfunction of lumbar region: Secondary | ICD-10-CM | POA: Diagnosis not present

## 2021-09-01 DIAGNOSIS — M546 Pain in thoracic spine: Secondary | ICD-10-CM | POA: Diagnosis not present

## 2021-09-01 DIAGNOSIS — M9901 Segmental and somatic dysfunction of cervical region: Secondary | ICD-10-CM | POA: Diagnosis not present

## 2021-09-01 DIAGNOSIS — M9902 Segmental and somatic dysfunction of thoracic region: Secondary | ICD-10-CM | POA: Diagnosis not present

## 2021-10-09 IMAGING — US US MFM OB TRANSVAGINAL
1 series · 13 of 28 positions shown · non-contrast
Comparison: none

[Series 1: us mfm ob transvaginal · 56 acquisitions, 13 frames shown]
[im 3/56]
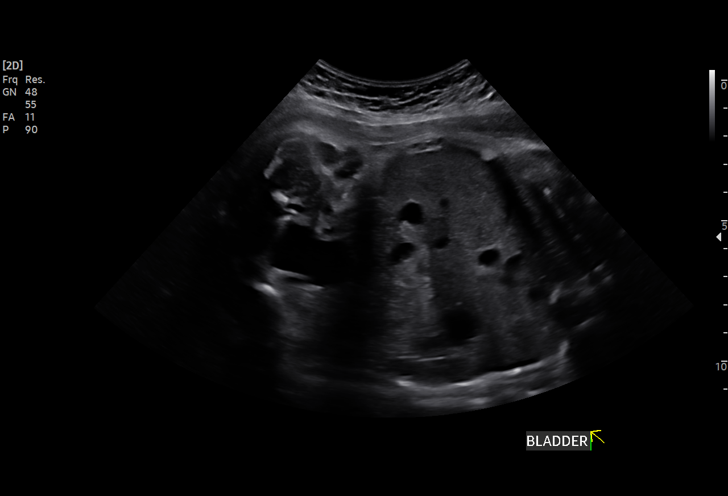
[im 7/56]
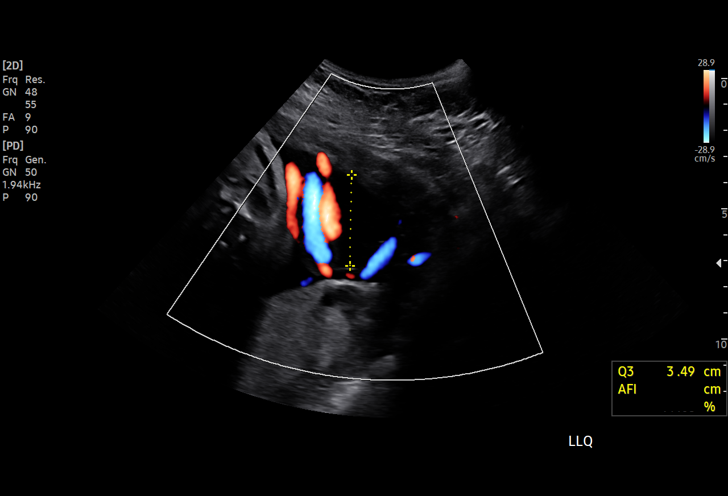
[im 11/56]
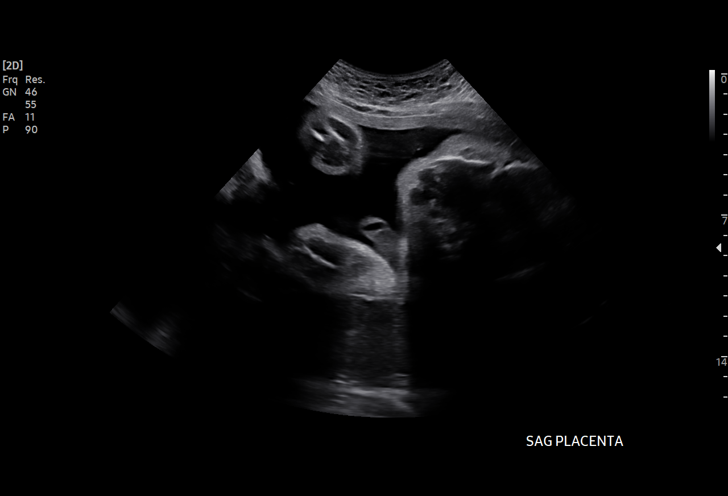
[im 15/56]
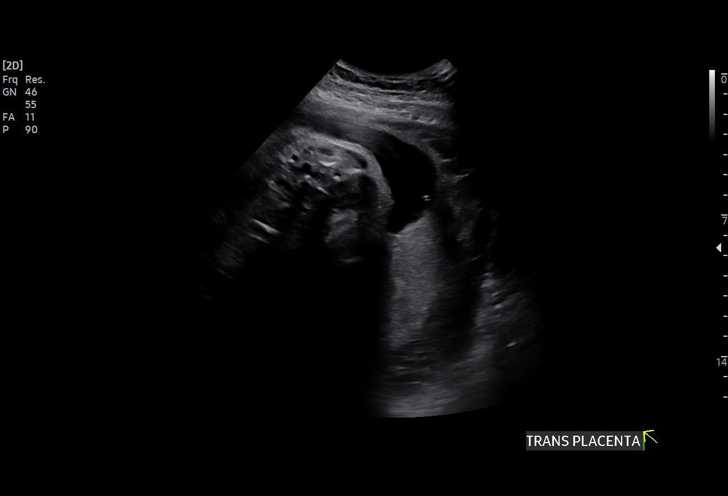
[im 19/56]
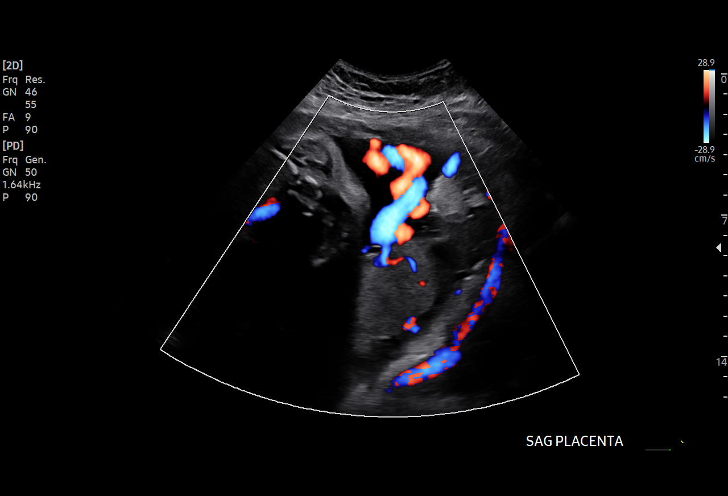
[im 23/56]
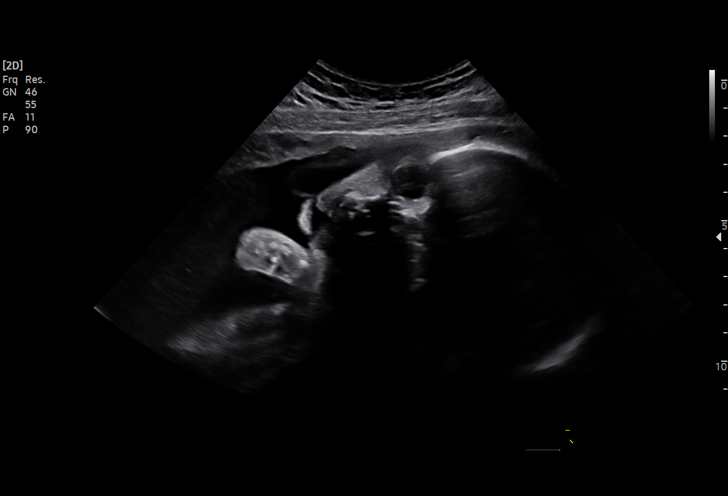
[im 29/56]
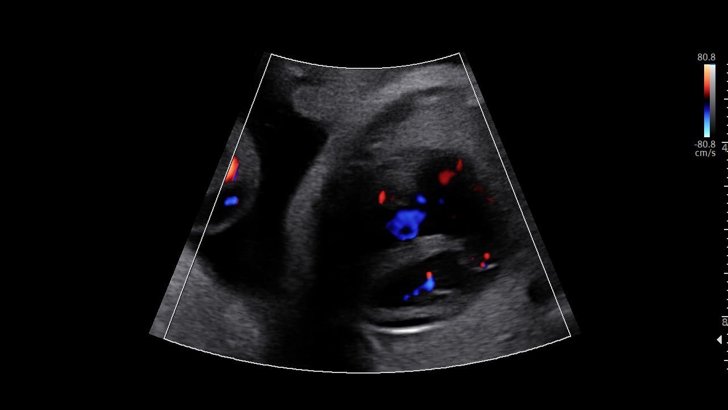
[im 33/56]
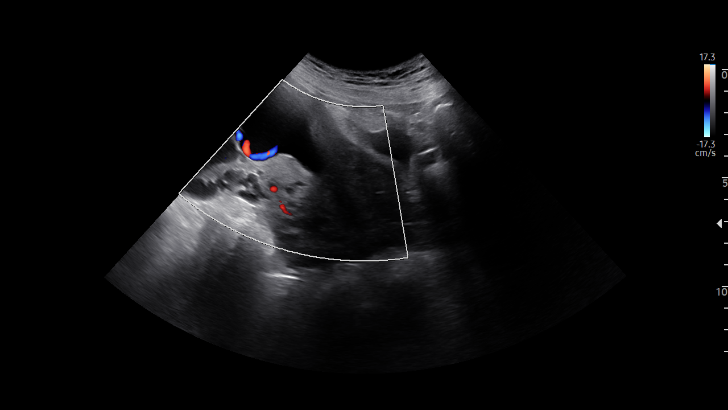
[im 37/56]
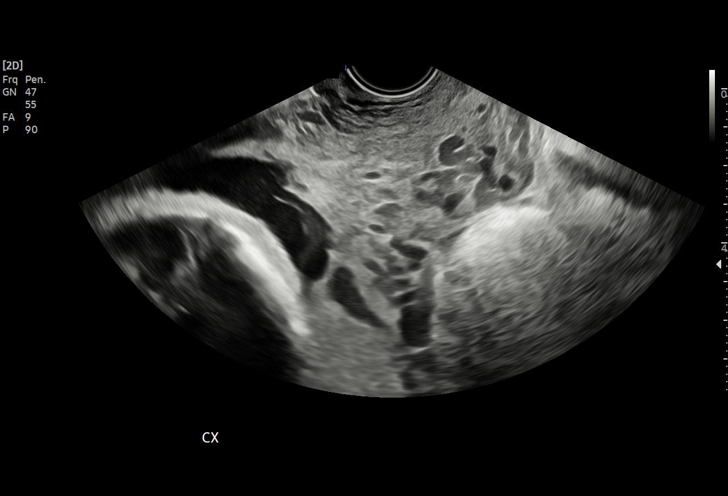
[im 41/56]
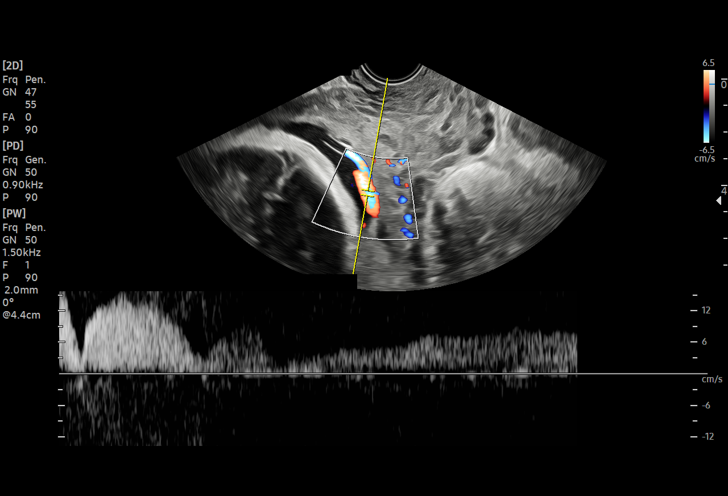
[im 45/56]
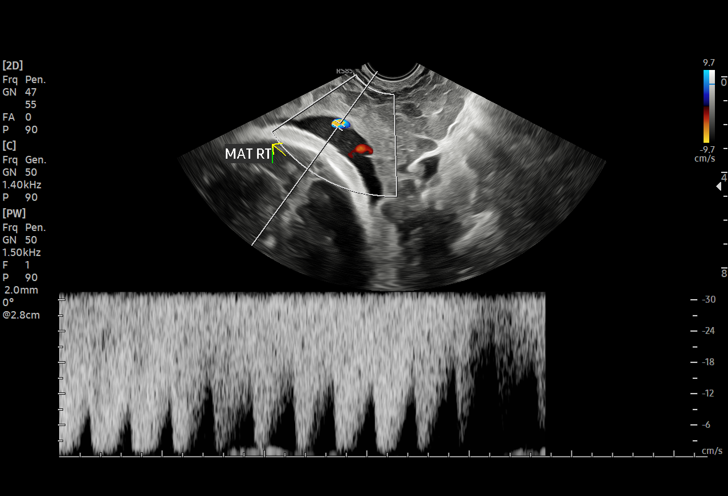
[im 49/56]
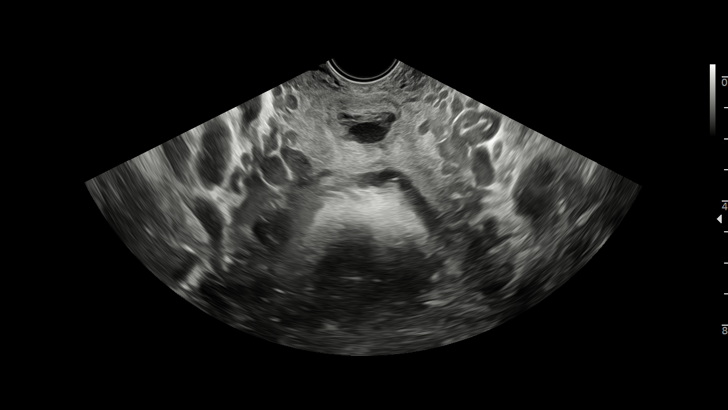
[im 53/56]
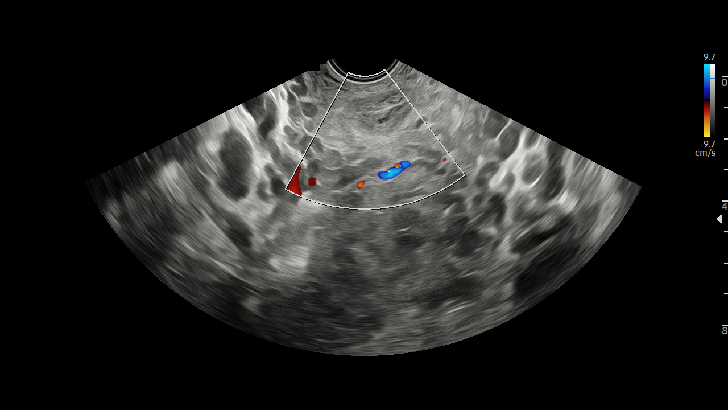

[13 of 28 positions shown; findings below may reference images not displayed]

[HOSPITAL],
                                                            Inc.

 1  US MFM OB LIMITED                     76815.01    [facility]
 2  US MFM OB TRANSVAGINAL                76817.2     [facility]

Indications

 31 weeks gestation of pregnancy
 Velamentous insertion of umbilical cord
 Prior poor obstetrical history antepartum,
 third trimester (pre-term labor)
Fetal Evaluation

 Num Of Fetuses:         1
 Fetal Heart Rate(bpm):  132
 Cardiac Activity:       Observed
 Presentation:           Cephalic
 Placenta:               Posterior
 P. Cord Insertion:      Velamentous insertion

 Amniotic Fluid
 AFI FV:      Within normal limits

 AFI Sum(cm)     %Tile       Largest Pocket(cm)
 15.57           55

 RUQ(cm)       RLQ(cm)       LUQ(cm)        LLQ(cm)
 7.15          4.93          0
Biometry

 LV:        5.4  mm
OB History

 Gravidity:    2
 Living:       1
Gestational Age

 Clinical EDD:  31w 5d                                        EDD:   07/17/20
 Best:          31w 5d     Det. By:  Clinical EDD             EDD:   07/17/20
Anatomy

 Ventricles:            Appears normal         Kidneys:                Appear normal
 Diaphragm:             Appears normal         Bladder:                Appears normal
 Stomach:               Appears normal, left
                        sided
Cervix Uterus Adnexa

 Cervix
 Length:              3  cm.
 Not visualized (advanced GA >22wks)

 Uterus
 No abnormality visualized.

 Right Ovary
 Not visualized.

 Left Ovary
 Not visualized.

 Cul De Sac
 No free fluid seen.

 Adnexa
 No adnexal mass visualized.
Impression

 Ms. Moralez, Vance2 P1 at 31w 5d gestation, is here for a
 second opinion. On your office ultrasound, vasa previa (type
 I) was suspected. Velamentous cord insertion was seen. On
 mid-trimester (18 weeks) fetal anatomy scan, placenta previa
 was detected that resolved.
 Patient had uneventful prenatal course so far. On cell-free
 fetal DNA screening, the risks of fetal aneuploidies are not
 increased.
 Obstetric history is significant for a term vaginal delivery in
 5235 of a female infant weighing 4,216 grams at birth.
 A limited ultrasound study showed normal amniotic fluid.
 Good fetal activity is seen. Cephalic presentation. Placenta is
 posterior with possible velamentous cord insertion. No
 succenturiate lobe is seen. On transabdominal scan, no fetal
 vessels are seen overlying the internal os. Fetal anatomical
 survey was not performed.
 After explaining, we performed transvaginal ultrasound. The
 cervix measures 3 cm, which is within normal range. Grey
 scale and color-Doppler were used to evaluate the lower
 uterine segment and internal os. Small funneling of internal
 os on displacing the fetal head upward. No overlying fetal
 vessels are seen at the internal os. On moving the probe to
 maternal right, fetal vessels are identified by pulse Doppler.
 On transverse view, the internal os and the lower segment
 region appeared clear with no fetal vessels.
 Although no vasa previa is seen (no fetal vessels overlying
 internal os), the distance of the fetal vessel (maternal right)
 from the internal os could not be clearly evaluated.
 Type I vasa previa is defined as fetal vessels within 2 cm
 from the internal os. The likelihood of type I vasa previa is
 less-likely but is a possibility.
 I counseled the patient on the above and recommended
 evaluation in 2 weeks.
Recommendations

 -An appointment was made for her to return on 06/04/20 for
 transvaginal assessment.
                 Arastumzada, Aliqeydar

## 2021-10-31 DIAGNOSIS — M546 Pain in thoracic spine: Secondary | ICD-10-CM | POA: Diagnosis not present

## 2021-10-31 DIAGNOSIS — M542 Cervicalgia: Secondary | ICD-10-CM | POA: Diagnosis not present

## 2021-10-31 DIAGNOSIS — M9903 Segmental and somatic dysfunction of lumbar region: Secondary | ICD-10-CM | POA: Diagnosis not present

## 2021-10-31 DIAGNOSIS — M9901 Segmental and somatic dysfunction of cervical region: Secondary | ICD-10-CM | POA: Diagnosis not present

## 2021-10-31 DIAGNOSIS — M9902 Segmental and somatic dysfunction of thoracic region: Secondary | ICD-10-CM | POA: Diagnosis not present

## 2021-11-16 DIAGNOSIS — Z3044 Encounter for surveillance of vaginal ring hormonal contraceptive device: Secondary | ICD-10-CM | POA: Diagnosis not present

## 2021-11-16 DIAGNOSIS — G43829 Menstrual migraine, not intractable, without status migrainosus: Secondary | ICD-10-CM | POA: Diagnosis not present

## 2021-11-16 DIAGNOSIS — Z1389 Encounter for screening for other disorder: Secondary | ICD-10-CM | POA: Diagnosis not present

## 2021-11-16 DIAGNOSIS — N941 Unspecified dyspareunia: Secondary | ICD-10-CM | POA: Diagnosis not present

## 2021-11-16 DIAGNOSIS — Z124 Encounter for screening for malignant neoplasm of cervix: Secondary | ICD-10-CM | POA: Diagnosis not present

## 2021-11-16 DIAGNOSIS — N760 Acute vaginitis: Secondary | ICD-10-CM | POA: Diagnosis not present

## 2021-11-16 DIAGNOSIS — Z01411 Encounter for gynecological examination (general) (routine) with abnormal findings: Secondary | ICD-10-CM | POA: Diagnosis not present

## 2021-11-16 DIAGNOSIS — Z130 Encounter for screening for diseases of the blood and blood-forming organs and certain disorders involving the immune mechanism: Secondary | ICD-10-CM | POA: Diagnosis not present

## 2022-01-11 DIAGNOSIS — M9901 Segmental and somatic dysfunction of cervical region: Secondary | ICD-10-CM | POA: Diagnosis not present

## 2022-01-11 DIAGNOSIS — M9903 Segmental and somatic dysfunction of lumbar region: Secondary | ICD-10-CM | POA: Diagnosis not present

## 2022-01-11 DIAGNOSIS — M542 Cervicalgia: Secondary | ICD-10-CM | POA: Diagnosis not present

## 2022-01-11 DIAGNOSIS — M546 Pain in thoracic spine: Secondary | ICD-10-CM | POA: Diagnosis not present

## 2022-01-11 DIAGNOSIS — M9902 Segmental and somatic dysfunction of thoracic region: Secondary | ICD-10-CM | POA: Diagnosis not present

## 2022-02-24 ENCOUNTER — Ambulatory Visit (INDEPENDENT_AMBULATORY_CARE_PROVIDER_SITE_OTHER): Payer: 59 | Admitting: Nurse Practitioner

## 2022-02-24 ENCOUNTER — Encounter: Payer: Self-pay | Admitting: Nurse Practitioner

## 2022-02-24 VITALS — BP 120/81 | HR 75 | Temp 98.6°F | Ht 67.0 in | Wt 184.0 lb

## 2022-02-24 DIAGNOSIS — Z23 Encounter for immunization: Secondary | ICD-10-CM | POA: Diagnosis not present

## 2022-02-24 DIAGNOSIS — Z0000 Encounter for general adult medical examination without abnormal findings: Secondary | ICD-10-CM | POA: Diagnosis not present

## 2022-02-24 NOTE — Patient Instructions (Signed)
Exercising to Stay Healthy To become healthy and stay healthy, it is recommended that you do moderate-intensity and vigorous-intensity exercise. You can tell that you are exercising at a moderate intensity if your heart starts beating faster and you start breathing faster but can still hold a conversation. You can tell that you are exercising at a vigorous intensity if you are breathing much harder and faster and cannot hold a conversation while exercising. How can exercise benefit me? Exercising regularly is important. It has many health benefits, such as: Improving overall fitness, flexibility, and endurance. Increasing bone density. Helping with weight control. Decreasing body fat. Increasing muscle strength and endurance. Reducing stress and tension, anxiety, depression, or anger. Improving overall health. What guidelines should I follow while exercising? Before you start a new exercise program, talk with your health care provider. Do not exercise so much that you hurt yourself, feel dizzy, or get very short of breath. Wear comfortable clothes and wear shoes with good support. Drink plenty of water while you exercise to prevent dehydration or heat stroke. Work out until your breathing and your heartbeat get faster (moderate intensity). How often should I exercise? Choose an activity that you enjoy, and set realistic goals. Your health care provider can help you make an activity plan that is individually designed and works best for you. Exercise regularly as told by your health care provider. This may include: Doing strength training two times a week, such as: Lifting weights. Using resistance bands. Push-ups. Sit-ups. Yoga. Doing a certain intensity of exercise for a given amount of time. Choose from these options: A total of 150 minutes of moderate-intensity exercise every week. A total of 75 minutes of vigorous-intensity exercise every week. A mix of moderate-intensity and  vigorous-intensity exercise every week. Children, pregnant women, people who have not exercised regularly, people who are overweight, and older adults may need to talk with a health care provider about what activities are safe to perform. If you have a medical condition, be sure to talk with your health care provider before you start a new exercise program. What are some exercise ideas? Moderate-intensity exercise ideas include: Walking 1 mile (1.6 km) in about 15 minutes. Biking. Hiking. Golfing. Dancing. Water aerobics. Vigorous-intensity exercise ideas include: Walking 4.5 miles (7.2 km) or more in about 1 hour. Jogging or running 5 miles (8 km) in about 1 hour. Biking 10 miles (16.1 km) or more in about 1 hour. Lap swimming. Roller-skating or in-line skating. Cross-country skiing. Vigorous competitive sports, such as football, basketball, and soccer. Jumping rope. Aerobic dancing. What are some everyday activities that can help me get exercise? Yard work, such as: Pushing a lawn mower. Raking and bagging leaves. Washing your car. Pushing a stroller. Shoveling snow. Gardening. Washing windows or floors. How can I be more active in my day-to-day activities? Use stairs instead of an elevator. Take a walk during your lunch break. If you drive, park your car farther away from your work or school. If you take public transportation, get off one stop early and walk the rest of the way. Stand up or walk around during all of your indoor phone calls. Get up, stretch, and walk around every 30 minutes throughout the day. Enjoy exercise with a friend. Support to continue exercising will help you keep a regular routine of activity. Where to find more information You can find more information about exercising to stay healthy from: U.S. Department of Health and Human Services: www.hhs.gov Centers for Disease Control and Prevention (  CDC): www.cdc.gov Summary Exercising regularly is  important. It will improve your overall fitness, flexibility, and endurance. Regular exercise will also improve your overall health. It can help you control your weight, reduce stress, and improve your bone density. Do not exercise so much that you hurt yourself, feel dizzy, or get very short of breath. Before you start a new exercise program, talk with your health care provider. This information is not intended to replace advice given to you by your health care provider. Make sure you discuss any questions you have with your health care provider. Document Revised: 09/10/2020 Document Reviewed: 09/10/2020 Elsevier Patient Education  2023 Else[employer] Inc.  

## 2022-02-24 NOTE — Progress Notes (Signed)
Subjective:    Patient ID: Erika Vasquez, female    DOB: [birth year], 31 y.o.   MRN: [id number]   Chief Complaint: Annual Exam   HPI Patient comes in today for annual physical. She has no chronic medical problems and is on no meds. 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 She is married with 2 kids.    Review of Systems  Constitutional:  Negative for diaphoresis.  Eyes:  Negative for pain.  Respiratory:  Negative for shortness of breath.   Cardiovascular:  Negative for chest pain, palpitations and leg swelling.  Gastrointestinal:  Negative for abdominal pain.  Endocrine: Negative for polydipsia.  Skin:  Negative for rash.  Neurological:  Negative for dizziness, weakness and headaches.  Hematological:  Does not bruise/bleed easily.  All other systems reviewed and are negative.      Objective:   Physical Exam Vitals and nursing note reviewed.  Constitutional:      General: She is not in acute distress.    Appearance: Normal appearance. She is well-developed.  HENT:     Head: Normocephalic.     Right Ear: Tympanic membrane normal.     Left Ear: Tympanic membrane normal.     Nose: Nose normal.     Mouth/Throat:     Mouth: Mucous membranes are moist.  Eyes:     Pupils: Pupils are equal, round, and reactive to light.  Neck:     Vascular: No carotid bruit or JVD.  Cardiovascular:     Rate and Rhythm: Normal rate and regular rhythm.     Heart sounds: Normal heart sounds.  Pulmonary:     Effort: Pulmonary effort is normal. No respiratory distress.     Breath sounds: Normal breath sounds. No wheezing or rales.  Chest:     Chest wall: No tenderness.  Abdominal:     General: Bowel sounds are normal. There is no distension or abdominal bruit.     Palpations: Abdomen is soft. There is no hepatomegaly, splenomegaly, mass or pulsatile mass.     Tenderness: There is no abdominal tenderness.  Musculoskeletal:        General: Normal range of motion.     Cervical back: Normal  range of motion and neck supple.  Lymphadenopathy:     Cervical: No cervical adenopathy.  Skin:    General: Skin is warm and dry.  Neurological:     Mental Status: She is alert and oriented to person, place, and time.     Deep Tendon Reflexes: Reflexes are normal and symmetric.  Psychiatric:        Behavior: Behavior normal.        Thought Content: Thought content normal.        Judgment: Judgment normal.     BP 120/81   Pulse 75   Temp 98.6 F (37 C) (Temporal)   Ht _0  (1.702 m)   Wt 184 lb (83.5 kg)   SpO2 99%   BMI 28.82 kg/m        Assessment & Plan:   Erika Vasquez comes in today with chief complaint of Annual Exam   Diagnosis and orders addressed:  1. Annual physical exam Labs pending - CBC with Differential/Platelet - CMP14+EGFR - Lipid panel - Thyroid Panel With TSH  2. Need for immunization against influenza - Flu Vaccine QUAD 50moIM (Fluarix, Fluzone & Alfiuria Quad PF)   Labs pending Health Maintenance reviewed Diet and exercise encouraged  Follow up plan: prn   Mary-Margaret  Hassell Done, [place]

## 2022-02-25 LAB — CBC WITH DIFFERENTIAL/PLATELET
Basophils Absolute: 0 10*3/uL (ref 0.0–0.2)
Basos: 0 %
EOS (ABSOLUTE): 0.2 10*3/uL (ref 0.0–0.4)
Eos: 3 %
Hematocrit: 40.9 % (ref 34.0–46.6)
Hemoglobin: 12.9 g/dL (ref 11.1–15.9)
Immature Grans (Abs): 0 10*3/uL (ref 0.0–0.1)
Immature Granulocytes: 0 %
Lymphocytes Absolute: 1.9 10*3/uL (ref 0.7–3.1)
Lymphs: 27 %
MCH: 28.2 pg (ref 26.6–33.0)
MCHC: 31.5 g/dL (ref 31.5–35.7)
MCV: 89 fL (ref 79–97)
Monocytes Absolute: 0.5 10*3/uL (ref 0.1–0.9)
Monocytes: 7 %
Neutrophils Absolute: 4.3 10*3/uL (ref 1.4–7.0)
Neutrophils: 63 %
Platelets: 253 10*3/uL (ref 150–450)
RBC: 4.58 x10E6/uL (ref 3.77–5.28)
RDW: 13.1 % (ref 11.7–15.4)
WBC: 6.9 10*3/uL (ref 3.4–10.8)

## 2022-02-25 LAB — CMP14+EGFR
ALT: 17 IU/L (ref 0–32)
AST: 19 IU/L (ref 0–40)
Albumin/Globulin Ratio: 2 (ref 1.2–2.2)
Albumin: 4.5 g/dL (ref 3.9–4.9)
Alkaline Phosphatase: 95 IU/L (ref 44–121)
BUN/Creatinine Ratio: 19 (ref 9–23)
BUN: 14 mg/dL (ref 6–20)
Bilirubin Total: 0.3 mg/dL (ref 0.0–1.2)
CO2: 22 mmol/L (ref 20–29)
Calcium: 9.6 mg/dL (ref 8.7–10.2)
Chloride: 107 mmol/L — ABNORMAL HIGH (ref 96–106)
Creatinine, Ser: 0.75 mg/dL (ref 0.57–1.00)
Globulin, Total: 2.3 g/dL (ref 1.5–4.5)
Glucose: 97 mg/dL (ref 70–99)
Potassium: 4.3 mmol/L (ref 3.5–5.2)
Sodium: 142 mmol/L (ref 134–144)
Total Protein: 6.8 g/dL (ref 6.0–8.5)
eGFR: 108 mL/min/{1.73_m2} (ref >59)

## 2022-02-25 LAB — THYROID PANEL WITH TSH
Free Thyroxine Index: 1.7 (ref 1.2–4.9)
T3 Uptake Ratio: 18 % — ABNORMAL LOW (ref 24–39)
T4, Total: 9.5 ug/dL (ref 4.5–12.0)
TSH: 1.09 u[IU]/mL (ref 0.450–4.500)

## 2022-02-25 LAB — LIPID PANEL
Chol/HDL Ratio: 3.6 ratio (ref 0.0–4.4)
Cholesterol, Total: 171 mg/dL (ref 100–199)
HDL: 48 mg/dL (ref >39)
LDL Chol Calc (NIH): 96 mg/dL (ref 0–99)
Triglycerides: 152 mg/dL — ABNORMAL HIGH (ref 0–149)
VLDL Cholesterol Cal: 27 mg/dL (ref 5–40)

## 2022-06-22 DIAGNOSIS — H5213 Myopia, bilateral: Secondary | ICD-10-CM | POA: Diagnosis not present

## 2022-11-16 DIAGNOSIS — D225 Melanocytic nevi of trunk: Secondary | ICD-10-CM | POA: Diagnosis not present

## 2022-11-16 DIAGNOSIS — D2272 Melanocytic nevi of left lower limb, including hip: Secondary | ICD-10-CM | POA: Diagnosis not present

## 2022-11-16 DIAGNOSIS — D2271 Melanocytic nevi of right lower limb, including hip: Secondary | ICD-10-CM | POA: Diagnosis not present

## 2022-11-16 DIAGNOSIS — D2261 Melanocytic nevi of right upper limb, including shoulder: Secondary | ICD-10-CM | POA: Diagnosis not present

## 2022-11-16 DIAGNOSIS — L918 Other hypertrophic disorders of the skin: Secondary | ICD-10-CM | POA: Diagnosis not present

## 2022-11-16 DIAGNOSIS — D2262 Melanocytic nevi of left upper limb, including shoulder: Secondary | ICD-10-CM | POA: Diagnosis not present

## 2022-11-16 DIAGNOSIS — D2372 Other benign neoplasm of skin of left lower limb, including hip: Secondary | ICD-10-CM | POA: Diagnosis not present

## 2022-11-16 DIAGNOSIS — D2239 Melanocytic nevi of other parts of face: Secondary | ICD-10-CM | POA: Diagnosis not present

## 2022-11-29 DIAGNOSIS — Z01419 Encounter for gynecological examination (general) (routine) without abnormal findings: Secondary | ICD-10-CM | POA: Diagnosis not present

## 2022-11-29 DIAGNOSIS — Z3044 Encounter for surveillance of vaginal ring hormonal contraceptive device: Secondary | ICD-10-CM | POA: Diagnosis not present

## 2022-11-29 DIAGNOSIS — Z130 Encounter for screening for diseases of the blood and blood-forming organs and certain disorders involving the immune mechanism: Secondary | ICD-10-CM | POA: Diagnosis not present

## 2022-11-29 DIAGNOSIS — Z1389 Encounter for screening for other disorder: Secondary | ICD-10-CM | POA: Diagnosis not present

## 2022-12-06 ENCOUNTER — Other Ambulatory Visit (HOSPITAL_COMMUNITY): Payer: Self-pay

## 2022-12-06 ENCOUNTER — Other Ambulatory Visit: Payer: Self-pay

## 2022-12-06 MED ORDER — ETONOGESTREL-ETHINYL ESTRADIOL 0.12-0.015 MG/24HR VA RING
VAGINAL_RING | VAGINAL | 0 refills | Status: DC
  Filled 2022-12-06: qty 3, 84d supply, fill #0

## 2022-12-07 ENCOUNTER — Other Ambulatory Visit: Payer: Self-pay

## 2023-02-22 ENCOUNTER — Other Ambulatory Visit: Payer: Self-pay

## 2023-02-22 ENCOUNTER — Other Ambulatory Visit (HOSPITAL_COMMUNITY): Payer: Self-pay

## 2023-02-22 MED ORDER — ETONOGESTREL-ETHINYL ESTRADIOL 0.12-0.015 MG/24HR VA RING
VAGINAL_RING | VAGINAL | 4 refills | Status: AC
  Filled 2023-02-22: qty 3, 84d supply, fill #0
  Filled 2023-07-21 – 2023-07-26 (×2): qty 3, 84d supply, fill #1
  Filled 2023-10-13: qty 3, 84d supply, fill #2

## 2023-02-23 ENCOUNTER — Other Ambulatory Visit: Payer: Self-pay

## 2023-02-26 ENCOUNTER — Encounter: Payer: 59 | Admitting: Nurse Practitioner

## 2023-03-26 ENCOUNTER — Encounter: Payer: Commercial Managed Care - PPO | Admitting: Nurse Practitioner

## 2023-03-28 ENCOUNTER — Encounter: Payer: Self-pay | Admitting: Nurse Practitioner

## 2023-04-10 ENCOUNTER — Encounter: Payer: Commercial Managed Care - PPO | Admitting: Nurse Practitioner

## 2023-04-12 ENCOUNTER — Encounter: Payer: Commercial Managed Care - PPO | Admitting: Nurse Practitioner

## 2023-06-08 ENCOUNTER — Encounter: Payer: Self-pay | Admitting: Nurse Practitioner

## 2023-06-08 ENCOUNTER — Ambulatory Visit (INDEPENDENT_AMBULATORY_CARE_PROVIDER_SITE_OTHER): Payer: Commercial Managed Care - PPO | Admitting: Nurse Practitioner

## 2023-06-08 VITALS — BP 125/86 | HR 78 | Temp 97.7°F | Resp 20 | Ht 67.0 in | Wt 185.0 lb

## 2023-06-08 DIAGNOSIS — Z0000 Encounter for general adult medical examination without abnormal findings: Secondary | ICD-10-CM

## 2023-06-08 NOTE — Patient Instructions (Signed)
 Exercising to Stay Healthy To become healthy and stay healthy, it is recommended that you do moderate-intensity and vigorous-intensity exercise. You can tell that you are exercising at a moderate intensity if your heart starts beating faster and you start breathing faster but can still hold a conversation. You can tell that you are exercising at a vigorous intensity if you are breathing much harder and faster and cannot hold a conversation while exercising. How can exercise benefit me? Exercising regularly is important. It has many health benefits, such as: Improving overall fitness, flexibility, and endurance. Increasing bone density. Helping with weight control. Decreasing body fat. Increasing muscle strength and endurance. Reducing stress and tension, anxiety, depression, or anger. Improving overall health. What guidelines should I follow while exercising? Before you start a new exercise program, talk with your health care provider. Do not exercise so much that you hurt yourself, feel dizzy, or get very short of breath. Wear comfortable clothes and wear shoes with good support. Drink plenty of water while you exercise to prevent dehydration or heat stroke. Work out until your breathing and your heartbeat get faster (moderate intensity). How often should I exercise? Choose an activity that you enjoy, and set realistic goals. Your health care provider can help you make an activity plan that is individually designed and works best for you. Exercise regularly as told by your health care provider. This may include: Doing strength training two times a week, such as: Lifting weights. Using resistance bands. Push-ups. Sit-ups. Yoga. Doing a certain intensity of exercise for a given amount of time. Choose from these options: A total of 150 minutes of moderate-intensity exercise every week. A total of 75 minutes of vigorous-intensity exercise every week. A mix of moderate-intensity and  vigorous-intensity exercise every week. Children, pregnant women, people who have not exercised regularly, people who are overweight, and older adults may need to talk with a health care provider about what activities are safe to perform. If you have a medical condition, be sure to talk with your health care provider before you start a new exercise program. What are some exercise ideas? Moderate-intensity exercise ideas include: Walking 1 mile (1.6 km) in about 15 minutes. Biking. Hiking. Golfing. Dancing. Water aerobics. Vigorous-intensity exercise ideas include: Walking 4.5 miles (7.2 km) or more in about 1 hour. Jogging or running 5 miles (8 km) in about 1 hour. Biking 10 miles (16.1 km) or more in about 1 hour. Lap swimming. Roller-skating or in-line skating. Cross-country skiing. Vigorous competitive sports, such as football, basketball, and soccer. Jumping rope. Aerobic dancing. What are some everyday activities that can help me get exercise? Yard work, such as: Child psychotherapist. Raking and bagging leaves. Washing your car. Pushing a stroller. Shoveling snow. Gardening. Washing windows or floors. How can I be more active in my day-to-day activities? Use stairs instead of an elevator. Take a walk during your lunch break. If you drive, park your car farther away from your work or school. If you take public transportation, get off one stop early and walk the rest of the way. Stand up or walk around during all of your indoor phone calls. Get up, stretch, and walk around every 30 minutes throughout the day. Enjoy exercise with a friend. Support to continue exercising will help you keep a regular routine of activity. Where to find more information You can find more information about exercising to stay healthy from: U.S. Department of Health and Human Services: ThisPath.fi Centers for Disease Control and Prevention (  CDC): FootballExhibition.com.br Summary Exercising regularly is  important. It will improve your overall fitness, flexibility, and endurance. Regular exercise will also improve your overall health. It can help you control your weight, reduce stress, and improve your bone density. Do not exercise so much that you hurt yourself, feel dizzy, or get very short of breath. Before you start a new exercise program, talk with your health care provider. This information is not intended to replace advice given to you by your health care provider. Make sure you discuss any questions you have with your health care provider. Document Revised: 09/10/2020 Document Reviewed: 09/10/2020 Elsevier Patient Education  2024 [employer].

## 2023-06-08 NOTE — Progress Notes (Signed)
 Subjective:    Patient ID: Erika Vasquez, female    DOB: 08/09/1990, 33 y.o.   MRN: [id number]   Chief Complaint: Annual Exam (No pap/)    HPI:  Erika Vasquez is a 33 y.o. who identifies as a female who was assigned female at birth.   Social history: Lives with: husband and 2 kids Work history: engineer, civil (consulting) for [employer]   Comes in today for follow up of the following chronic medical issues:  There are no diagnoses linked to this encounter.  New complaints: None today  No Known Allergies Outpatient Encounter Medications as of 06/08/2023  Medication Sig   etonogestrel -ethinyl estradiol  (NUVARING) 0.12-0.015 MG/24HR vaginal ring insert 1 ring vaginally for 3 weeks then remove for 1 week   [DISCONTINUED] etonogestrel -ethinyl estradiol  (NUVARING) 0.12-0.015 MG/24HR vaginal ring Place vaginally.   [DISCONTINUED] influenza vac split quadrivalent PF (FLUARIX) 0.5 ML injection Inject into the muscle. (Patient not taking: Reported on 02/24/2022)   No facility-administered encounter medications on file as of 06/08/2023.    Past Surgical History:  Procedure Laterality Date   WISDOM TOOTH EXTRACTION  09/2014    Family History  Problem Relation Age of Onset   Hypertension Mother    Miscarriages / Stillbirths Mother    Anxiety disorder Father    Depression Father    Cancer Father    Obesity Father       Controlled substance contract: n/a     Review of Systems  Constitutional:  Negative for diaphoresis.  Eyes:  Negative for pain.  Respiratory:  Negative for shortness of breath.   Cardiovascular:  Negative for chest pain, palpitations and leg swelling.  Gastrointestinal:  Negative for abdominal pain.  Endocrine: Negative for polydipsia.  Skin:  Negative for rash.  Neurological:  Negative for dizziness, weakness and headaches.  Hematological:  Does not bruise/bleed easily.  All other systems reviewed and are negative.      Objective:   Physical  Exam Vitals and nursing note reviewed.  Constitutional:      General: She is not in acute distress.    Appearance: Normal appearance. She is well-developed.  HENT:     Head: Normocephalic.     Right Ear: Tympanic membrane normal.     Left Ear: Tympanic membrane normal.     Nose: Nose normal.     Mouth/Throat:     Mouth: Mucous membranes are moist.  Eyes:     Pupils: Pupils are equal, round, and reactive to light.  Neck:     Vascular: No carotid bruit or JVD.  Cardiovascular:     Rate and Rhythm: Normal rate and regular rhythm.     Heart sounds: Normal heart sounds.  Pulmonary:     Effort: Pulmonary effort is normal. No respiratory distress.     Breath sounds: Normal breath sounds. No wheezing or rales.  Chest:     Chest wall: No tenderness.  Abdominal:     General: Bowel sounds are normal. There is no distension or abdominal bruit.     Palpations: Abdomen is soft. There is no hepatomegaly, splenomegaly, mass or pulsatile mass.     Tenderness: There is no abdominal tenderness.  Musculoskeletal:        General: Normal range of motion.     Cervical back: Normal range of motion and neck supple.  Lymphadenopathy:     Cervical: No cervical adenopathy.  Skin:    General: Skin is warm and dry.  Neurological:     Mental  Status: She is alert and oriented to person, place, and time.     Deep Tendon Reflexes: Reflexes are normal and symmetric.  Psychiatric:        Behavior: Behavior normal.        Thought Content: Thought content normal.        Judgment: Judgment normal.     BP 125/86   Pulse 78   Temp 97.7 F (36.5 C) (Temporal)   Resp 20   Ht 5' 7 (1.702 m)   Wt 185 lb (83.9 kg)   SpO2 100%   BMI 28.98 kg/m        Assessment & Plan:   Erika Vasquez in today with chief complaint of Annual Exam (No pap/)   1. Annual physical exam (Primary) Exercise to stay healthy - CMP14+EGFR - CBC with Differential/Platelet - Lipid panel - Thyroid  Panel With  TSH    The above assessment and management plan was discussed with the patient. The patient verbalized understanding of and has agreed to the management plan. Patient is aware to call the clinic if symptoms persist or worsen. Patient is aware when to return to the clinic for a follow-up visit. Patient educated on when it is appropriate to go to the emergency department.   Mary-Margaret Gladis, FNP

## 2023-06-09 LAB — LIPID PANEL
Chol/HDL Ratio: 3.7 {ratio} (ref 0.0–4.4)
Cholesterol, Total: 173 mg/dL (ref 100–199)
HDL: 47 mg/dL (ref >39)
LDL Chol Calc (NIH): 104 mg/dL — ABNORMAL HIGH (ref 0–99)
Triglycerides: 125 mg/dL (ref 0–149)
VLDL Cholesterol Cal: 22 mg/dL (ref 5–40)

## 2023-06-09 LAB — CBC WITH DIFFERENTIAL/PLATELET
Basophils Absolute: 0 10*3/uL (ref 0.0–0.2)
Basos: 1 %
EOS (ABSOLUTE): 0.2 10*3/uL (ref 0.0–0.4)
Eos: 3 %
Hematocrit: 41.3 % (ref 34.0–46.6)
Hemoglobin: 13.2 g/dL (ref 11.1–15.9)
Immature Grans (Abs): 0 10*3/uL (ref 0.0–0.1)
Immature Granulocytes: 0 %
Lymphocytes Absolute: 1.4 10*3/uL (ref 0.7–3.1)
Lymphs: 24 %
MCH: 29.5 pg (ref 26.6–33.0)
MCHC: 32 g/dL (ref 31.5–35.7)
MCV: 92 fL (ref 79–97)
Monocytes Absolute: 0.4 10*3/uL (ref 0.1–0.9)
Monocytes: 7 %
Neutrophils Absolute: 3.9 10*3/uL (ref 1.4–7.0)
Neutrophils: 65 %
Platelets: 244 10*3/uL (ref 150–450)
RBC: 4.47 x10E6/uL (ref 3.77–5.28)
RDW: 12 % (ref 11.7–15.4)
WBC: 6 10*3/uL (ref 3.4–10.8)

## 2023-06-09 LAB — CMP14+EGFR
ALT: 24 [IU]/L (ref 0–32)
AST: 25 [IU]/L (ref 0–40)
Albumin: 4.3 g/dL (ref 3.9–4.9)
Alkaline Phosphatase: 90 [IU]/L (ref 44–121)
BUN/Creatinine Ratio: 17 (ref 9–23)
BUN: 12 mg/dL (ref 6–20)
Bilirubin Total: 0.4 mg/dL (ref 0.0–1.2)
CO2: 22 mmol/L (ref 20–29)
Calcium: 9.2 mg/dL (ref 8.7–10.2)
Chloride: 106 mmol/L (ref 96–106)
Creatinine, Ser: 0.69 mg/dL (ref 0.57–1.00)
Globulin, Total: 2.5 g/dL (ref 1.5–4.5)
Glucose: 87 mg/dL (ref 70–99)
Potassium: 4.1 mmol/L (ref 3.5–5.2)
Sodium: 140 mmol/L (ref 134–144)
Total Protein: 6.8 g/dL (ref 6.0–8.5)
eGFR: 117 mL/min/{1.73_m2} (ref >59)

## 2023-06-09 LAB — THYROID PANEL WITH TSH
Free Thyroxine Index: 2 (ref 1.2–4.9)
T3 Uptake Ratio: 22 % — ABNORMAL LOW (ref 24–39)
T4, Total: 9.2 ug/dL (ref 4.5–12.0)
TSH: 1.03 u[IU]/mL (ref 0.450–4.500)

## 2023-07-21 ENCOUNTER — Other Ambulatory Visit (HOSPITAL_COMMUNITY): Payer: Self-pay

## 2023-07-26 ENCOUNTER — Other Ambulatory Visit (HOSPITAL_COMMUNITY): Payer: Self-pay

## 2023-08-03 ENCOUNTER — Ambulatory Visit: Admitting: Nurse Practitioner

## 2023-08-17 ENCOUNTER — Other Ambulatory Visit (HOSPITAL_COMMUNITY): Payer: Self-pay

## 2023-10-12 ENCOUNTER — Ambulatory Visit: Admitting: Podiatry

## 2023-10-15 ENCOUNTER — Other Ambulatory Visit (HOSPITAL_COMMUNITY): Payer: Self-pay

## 2023-10-30 ENCOUNTER — Ambulatory Visit: Admitting: Podiatry

## 2023-10-30 DIAGNOSIS — R52 Pain, unspecified: Secondary | ICD-10-CM

## 2023-10-30 DIAGNOSIS — B070 Plantar wart: Secondary | ICD-10-CM

## 2023-10-30 NOTE — Progress Notes (Signed)
  Subjective:  Patient ID: Erika Vasquez, female    DOB: Oct 25, 1990,  MRN: [id number]  Chief Complaint  Patient presents with   Plantar Warts    RM#13 Patient states had warts several years ago and now back behind both great toes.    Discussed the use of AI scribe software for clinical note transcription with the patient, who gave verbal consent to proceed.  History of Present Illness Erika Vasquez is a 33 year old female who presents with recurrent warts on both feet, which is new.   She has had recurrent warts on both feet for two weeks, primarily on the big toes.  She tried over-the-counter medication which helped some but she was given trouble to get to bed.  She had warts previously but in different areas of the foot.   She manages them with over-the-counter compound gel and paring. Similar warts occurred three to four years ago but in different areas, treated with ointments and laser therapy.   She is not pregnant or breastfeeding and has no known medication allergies. She does not receive pedicures and is unsure of the cause of recurrence. No signs of infection are present. She is concerned about the recurrence timing due to an upcoming beach visit.      Objective:    Physical Exam General: AAO x3, NAD  Dermatological: On the medial aspect of bilateral hallux is hyperkeratotic tissue.  There is minimal pinpoint bleeding noted.  There is no surrounding erythema, ascending cellulitis.  The left foot submetatarsal 1 with a similar lesion with there is new, pink skin present. No open lesions.   Vascular: Dorsalis Pedis artery and Posterior Tibial artery pedal pulses are 2/4 bilateral with immedate capillary fill time.  There is no pain with calf compression, swelling, warmth, erythema.   Neruologic: Grossly intact via light touch bilateral.   Musculoskeletal: There is no tenderness on exam.  Gait: Unassisted, Nonantalgic.     No images are attached to the  encounter.    Results    Assessment:   1. Plantar wart      Plan:  Patient was evaluated and treated and all questions answered.  Assessment and Plan Assessment & Plan Warts on feet Skin lesions on big toes, manageable with topical treatment. Differential includes wart, callus, porokeratosis.  - Sharply debrided lesion or any complications or bleeding.  Apply cantharone for 8 hours, wash off with soap and water. Remove sooner if pain or burning occurs. - Monitor for infection signs: redness, drainage. Report if present. - Cover areas with a bandage. - Use antibiotic ointment and bandage if blistering occurs. - Continue over-the-counter compound gel in a couple of days if needed, monitor for infection.  Return if symptoms worsen or fail to improve.   Charity Conch DPM

## 2023-10-30 NOTE — Patient Instructions (Signed)
 Take dressing off in 8 hours and wash the foot with soap and water. If it is hurting or becomes uncomfortable before the 8 hours, go ahead and remove the bandage and wash the area.  If it blisters, apply antibiotic ointment and a band-aid.  Monitor for any signs/symptoms of infection. Call the office immediately if any occur or go directly to the emergency room. Call with any questions/concerns.

## 2023-12-24 DIAGNOSIS — Z130 Encounter for screening for diseases of the blood and blood-forming organs and certain disorders involving the immune mechanism: Secondary | ICD-10-CM | POA: Diagnosis not present

## 2023-12-24 DIAGNOSIS — Z30011 Encounter for initial prescription of contraceptive pills: Secondary | ICD-10-CM | POA: Diagnosis not present

## 2023-12-24 DIAGNOSIS — Z01419 Encounter for gynecological examination (general) (routine) without abnormal findings: Secondary | ICD-10-CM | POA: Diagnosis not present

## 2023-12-24 DIAGNOSIS — Z1389 Encounter for screening for other disorder: Secondary | ICD-10-CM | POA: Diagnosis not present

## 2023-12-24 DIAGNOSIS — Z3009 Encounter for other general counseling and advice on contraception: Secondary | ICD-10-CM | POA: Diagnosis not present

## 2024-03-19 ENCOUNTER — Other Ambulatory Visit (HOSPITAL_COMMUNITY): Payer: Self-pay

## 2024-04-22 ENCOUNTER — Other Ambulatory Visit (HOSPITAL_COMMUNITY): Payer: Self-pay
